# Patient Record
Sex: Female | Born: 1985 | Race: Black or African American | Hispanic: No | Marital: Single | Smoking: Current every day smoker
Health system: Southern US, Community
[De-identification: ages and names within clinical notes are randomized; demographics above are authoritative.]

## PROBLEM LIST (undated history)

## (undated) ENCOUNTER — Inpatient Hospital Stay (HOSPITAL_COMMUNITY): Payer: Self-pay

## (undated) DIAGNOSIS — F329 Major depressive disorder, single episode, unspecified: Secondary | ICD-10-CM

## (undated) DIAGNOSIS — B977 Papillomavirus as the cause of diseases classified elsewhere: Secondary | ICD-10-CM

## (undated) DIAGNOSIS — F410 Panic disorder [episodic paroxysmal anxiety] without agoraphobia: Secondary | ICD-10-CM

## (undated) DIAGNOSIS — F32A Depression, unspecified: Secondary | ICD-10-CM

## (undated) HISTORY — PX: NO PAST SURGERIES: SHX2092

---
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
## 2004-12-14 ENCOUNTER — Ambulatory Visit (HOSPITAL_COMMUNITY): Admission: RE | Admit: 2004-12-14 | Discharge: 2004-12-14 | Payer: Self-pay | Admitting: *Deleted

## 2005-01-16 ENCOUNTER — Inpatient Hospital Stay (HOSPITAL_COMMUNITY): Admission: AD | Admit: 2005-01-16 | Discharge: 2005-01-16 | Payer: Self-pay | Admitting: *Deleted

## 2005-01-16 ENCOUNTER — Ambulatory Visit: Payer: Self-pay | Admitting: *Deleted

## 2005-01-22 ENCOUNTER — Ambulatory Visit: Payer: Self-pay | Admitting: Obstetrics and Gynecology

## 2005-01-22 ENCOUNTER — Inpatient Hospital Stay (HOSPITAL_COMMUNITY): Admission: AD | Admit: 2005-01-22 | Discharge: 2005-01-23 | Payer: Self-pay | Admitting: Family Medicine

## 2005-03-27 ENCOUNTER — Inpatient Hospital Stay (HOSPITAL_COMMUNITY): Admission: AD | Admit: 2005-03-27 | Discharge: 2005-03-29 | Payer: Self-pay | Admitting: *Deleted

## 2005-03-27 ENCOUNTER — Ambulatory Visit: Payer: Self-pay | Admitting: *Deleted

## 2005-11-12 ENCOUNTER — Inpatient Hospital Stay (HOSPITAL_COMMUNITY): Admission: AD | Admit: 2005-11-12 | Discharge: 2005-11-12 | Payer: Self-pay | Admitting: Obstetrics & Gynecology

## 2006-04-10 ENCOUNTER — Emergency Department (HOSPITAL_COMMUNITY): Admission: EM | Admit: 2006-04-10 | Discharge: 2006-04-10 | Payer: Self-pay | Admitting: Family Medicine

## 2006-11-28 ENCOUNTER — Inpatient Hospital Stay (HOSPITAL_COMMUNITY): Admission: AD | Admit: 2006-11-28 | Discharge: 2006-11-28 | Payer: Self-pay | Admitting: Family Medicine

## 2006-11-28 ENCOUNTER — Ambulatory Visit: Payer: Self-pay | Admitting: *Deleted

## 2007-01-12 ENCOUNTER — Inpatient Hospital Stay (HOSPITAL_COMMUNITY): Admission: AD | Admit: 2007-01-12 | Discharge: 2007-01-12 | Payer: Self-pay | Admitting: Obstetrics & Gynecology

## 2007-01-12 ENCOUNTER — Ambulatory Visit: Payer: Self-pay | Admitting: Advanced Practice Midwife

## 2007-01-15 ENCOUNTER — Ambulatory Visit: Payer: Self-pay | Admitting: *Deleted

## 2007-01-22 ENCOUNTER — Ambulatory Visit: Payer: Self-pay | Admitting: Obstetrics & Gynecology

## 2007-02-06 ENCOUNTER — Inpatient Hospital Stay (HOSPITAL_COMMUNITY): Admission: AD | Admit: 2007-02-06 | Discharge: 2007-02-06 | Payer: Self-pay | Admitting: Obstetrics and Gynecology

## 2007-02-06 ENCOUNTER — Ambulatory Visit: Payer: Self-pay | Admitting: Obstetrics and Gynecology

## 2007-02-19 ENCOUNTER — Ambulatory Visit: Payer: Self-pay | Admitting: Obstetrics & Gynecology

## 2007-02-26 ENCOUNTER — Inpatient Hospital Stay (HOSPITAL_COMMUNITY): Admission: RE | Admit: 2007-02-26 | Discharge: 2007-02-28 | Payer: Self-pay | Admitting: Obstetrics & Gynecology

## 2007-02-26 ENCOUNTER — Ambulatory Visit: Payer: Self-pay | Admitting: *Deleted

## 2009-08-28 ENCOUNTER — Emergency Department (HOSPITAL_COMMUNITY): Admission: EM | Admit: 2009-08-28 | Discharge: 2009-08-28 | Payer: Self-pay | Admitting: Emergency Medicine

## 2009-11-24 ENCOUNTER — Emergency Department (HOSPITAL_COMMUNITY)
Admission: EM | Admit: 2009-11-24 | Discharge: 2009-11-24 | Payer: Self-pay | Source: Home / Self Care | Admitting: Emergency Medicine

## 2010-03-23 ENCOUNTER — Emergency Department (HOSPITAL_COMMUNITY)
Admission: EM | Admit: 2010-03-23 | Discharge: 2010-03-23 | Payer: Self-pay | Source: Home / Self Care | Admitting: Family Medicine

## 2010-05-11 LAB — URINE MICROSCOPIC-ADD ON

## 2010-05-11 LAB — URINALYSIS, ROUTINE W REFLEX MICROSCOPIC
Glucose, UA: NEGATIVE mg/dL
Hgb urine dipstick: NEGATIVE
Ketones, ur: 15 mg/dL — AB
Nitrite: NEGATIVE
Protein, ur: NEGATIVE mg/dL
Specific Gravity, Urine: 1.026 (ref 1.005–1.030)
Urobilinogen, UA: 1 mg/dL (ref 0.0–1.0)
pH: 7 (ref 5.0–8.0)

## 2010-05-11 LAB — WET PREP, GENITAL
Trich, Wet Prep: NONE SEEN
Yeast Wet Prep HPF POC: NONE SEEN

## 2010-05-11 LAB — POCT PREGNANCY, URINE: Preg Test, Ur: NEGATIVE

## 2010-05-11 LAB — GC/CHLAMYDIA PROBE AMP, GENITAL
Chlamydia, DNA Probe: NEGATIVE
GC Probe Amp, Genital: POSITIVE — AB

## 2010-05-14 LAB — URINALYSIS, ROUTINE W REFLEX MICROSCOPIC
Bilirubin Urine: NEGATIVE
Glucose, UA: NEGATIVE mg/dL
Hgb urine dipstick: NEGATIVE
Ketones, ur: NEGATIVE mg/dL
Nitrite: NEGATIVE
Protein, ur: NEGATIVE mg/dL
Specific Gravity, Urine: 1.019 (ref 1.005–1.030)
Urobilinogen, UA: 0.2 mg/dL (ref 0.0–1.0)
pH: 6 (ref 5.0–8.0)

## 2010-05-14 LAB — POCT PREGNANCY, URINE: Preg Test, Ur: NEGATIVE

## 2010-08-28 ENCOUNTER — Inpatient Hospital Stay (HOSPITAL_COMMUNITY)
Admission: AD | Admit: 2010-08-28 | Discharge: 2010-08-29 | Disposition: A | Discharge status: Home / Self Care | Payer: Medicaid Other | Source: Ambulatory Visit | Attending: Obstetrics & Gynecology | Admitting: Obstetrics & Gynecology

## 2010-08-28 ENCOUNTER — Inpatient Hospital Stay (HOSPITAL_COMMUNITY): Payer: Medicaid Other

## 2010-08-28 DIAGNOSIS — O209 Hemorrhage in early pregnancy, unspecified: Secondary | ICD-10-CM | POA: Insufficient documentation

## 2010-08-28 LAB — URINALYSIS, ROUTINE W REFLEX MICROSCOPIC
Bilirubin Urine: NEGATIVE
Glucose, UA: NEGATIVE mg/dL
Hgb urine dipstick: NEGATIVE
Ketones, ur: NEGATIVE mg/dL
Nitrite: NEGATIVE
Protein, ur: NEGATIVE mg/dL
Specific Gravity, Urine: >1.03 — ABNORMAL HIGH (ref 1.005–1.030)
Urobilinogen, UA: 0.2 mg/dL (ref 0.0–1.0)
pH: 6 (ref 5.0–8.0)

## 2010-08-28 LAB — URINE MICROSCOPIC-ADD ON

## 2010-08-28 LAB — CBC
HCT: 29.6 % — ABNORMAL LOW (ref 36.0–46.0)
Hemoglobin: 9.9 g/dL — ABNORMAL LOW (ref 12.0–15.0)
MCH: 30.5 pg (ref 26.0–34.0)
MCHC: 33.4 g/dL (ref 30.0–36.0)
MCV: 91.1 fL (ref 78.0–100.0)
Platelets: 208 10*3/uL (ref 150–400)
RBC: 3.25 MIL/uL — ABNORMAL LOW (ref 3.87–5.11)
RDW: 13.6 % (ref 11.5–15.5)
WBC: 6.2 10*3/uL (ref 4.0–10.5)

## 2010-08-28 LAB — WET PREP, GENITAL
Trich, Wet Prep: NONE SEEN
Yeast Wet Prep HPF POC: NONE SEEN

## 2010-08-28 LAB — ABO/RH: ABO/RH(D): O POS

## 2010-08-29 LAB — GC/CHLAMYDIA PROBE AMP, GENITAL
Chlamydia, DNA Probe: NEGATIVE
GC Probe Amp, Genital: NEGATIVE

## 2010-12-01 LAB — CBC
HCT: 30.1 — ABNORMAL LOW
Hemoglobin: 10.3 — ABNORMAL LOW
MCHC: 34.1
MCV: 80.7
Platelets: 196
RBC: 3.73 — ABNORMAL LOW
RDW: 15.1
WBC: 6.9

## 2010-12-01 LAB — POCT URINALYSIS DIP (DEVICE)
Glucose, UA: NEGATIVE
Hgb urine dipstick: NEGATIVE
Ketones, ur: NEGATIVE
Nitrite: NEGATIVE
Operator id: 135281
Protein, ur: NEGATIVE
Specific Gravity, Urine: 1.025
Urobilinogen, UA: 4 — ABNORMAL HIGH
pH: 7

## 2010-12-01 LAB — RPR: RPR Ser Ql: NONREACTIVE

## 2010-12-03 ENCOUNTER — Inpatient Hospital Stay: Payer: Self-pay

## 2010-12-05 LAB — URINALYSIS, ROUTINE W REFLEX MICROSCOPIC
Bilirubin Urine: NEGATIVE
Glucose, UA: NEGATIVE
Hgb urine dipstick: NEGATIVE
Specific Gravity, Urine: 1.01
pH: 6

## 2010-12-05 LAB — POCT URINALYSIS DIP (DEVICE)
Bilirubin Urine: NEGATIVE
Bilirubin Urine: NEGATIVE
Glucose, UA: NEGATIVE
Glucose, UA: NEGATIVE
Hgb urine dipstick: NEGATIVE
Hgb urine dipstick: NEGATIVE
Ketones, ur: NEGATIVE
Ketones, ur: NEGATIVE
pH: 7
pH: 7.5

## 2010-12-05 LAB — STREP B DNA PROBE: Strep Group B Ag: POSITIVE

## 2010-12-05 LAB — GC/CHLAMYDIA PROBE AMP, GENITAL: Chlamydia, DNA Probe: NEGATIVE

## 2010-12-05 LAB — WET PREP, GENITAL
Clue Cells Wet Prep HPF POC: NONE SEEN
Yeast Wet Prep HPF POC: NONE SEEN

## 2010-12-05 LAB — RAPID URINE DRUG SCREEN, HOSP PERFORMED
Barbiturates: NOT DETECTED
Opiates: NOT DETECTED

## 2010-12-05 LAB — URINE MICROSCOPIC-ADD ON: RBC / HPF: NONE SEEN

## 2010-12-07 LAB — CBC
Hemoglobin: 11.3 — ABNORMAL LOW
MCHC: 34
RBC: 3.73 — ABNORMAL LOW
WBC: 7.9

## 2010-12-07 LAB — URINALYSIS, ROUTINE W REFLEX MICROSCOPIC
Bilirubin Urine: NEGATIVE
Nitrite: NEGATIVE
Specific Gravity, Urine: 1.025
Urobilinogen, UA: 0.2
pH: 7

## 2010-12-07 LAB — URINE MICROSCOPIC-ADD ON

## 2010-12-07 LAB — RAPID URINE DRUG SCREEN, HOSP PERFORMED
Amphetamines: NOT DETECTED
Barbiturates: NOT DETECTED
Benzodiazepines: NOT DETECTED
Tetrahydrocannabinol: NOT DETECTED

## 2010-12-07 LAB — TYPE AND SCREEN
ABO/RH(D): O POS
Antibody Screen: NEGATIVE

## 2010-12-07 LAB — ABO/RH: ABO/RH(D): O POS

## 2011-01-26 ENCOUNTER — Inpatient Hospital Stay (HOSPITAL_COMMUNITY)
Admission: AD | Admit: 2011-01-26 | Discharge: 2011-01-26 | Disposition: A | Discharge status: Home / Self Care | Payer: Medicaid Other | Source: Ambulatory Visit | Attending: Family Medicine | Admitting: Family Medicine

## 2011-01-26 ENCOUNTER — Encounter (HOSPITAL_COMMUNITY): Payer: Self-pay | Admitting: *Deleted

## 2011-01-26 HISTORY — DX: Depression, unspecified: F32.A

## 2011-01-26 HISTORY — DX: Major depressive disorder, single episode, unspecified: F32.9

## 2011-01-26 HISTORY — DX: Panic disorder (episodic paroxysmal anxiety): F41.0

## 2011-01-26 MED ORDER — DICLOFENAC SODIUM 75 MG PO TBEC: 75.0000 mg | DELAYED_RELEASE_TABLET | Freq: Two times a day (BID) | ORAL | Status: AC

## 2011-01-26 MED ORDER — KETOROLAC TROMETHAMINE 60 MG/2ML IM SOLN
60.0000 mg | Freq: Once | INTRAMUSCULAR | Status: AC
  Administered 2011-01-26: 60 mg via INTRAMUSCULAR
  Filled 2011-01-26: qty 2

## 2011-01-26 NOTE — ED Notes (Signed)
Delivered vaginal on the 12/04/10; started Depo 12/06/10; pt has not stopped bleeding since her delivery; c/o cramping in her back and abdomen, that she rates a 8;

## 2011-01-26 NOTE — ED Notes (Signed)
Rx for Diclofenac 75 mg EC bid #60, no refills called in to pharmacy by RN per CNM request;

## 2011-01-26 NOTE — Progress Notes (Signed)
Pt states, " I delivered vaginally at [facility] 12-04-10, and I've have bleeding ever since I delivered. It slows down to spotting then picks back up the next day. I'm having cramping in low abdomen and low back and last night it felt like contractions. I also have pain in left knee and my middle finger on my right hand."

## 2011-01-26 NOTE — ED Provider Notes (Signed)
History    Stephanie Mckay is a 25 YO [id number] who delivered recently on 12/04/2010 presents today with ongoing bleeding and cramps.  Chief Complaint  Patient presents with   Vaginal Bleeding   Abdominal Cramping   Back Pain   Hand Pain   Knee Pain   HPI Patient reports after giving birth (uncomplicated pregnancy and delivery) she continues to have bleeding and cramping in addition to back pain. She has not experienced this with prior pregnancy. She uses about 2-3 pads within 24 hours and has lower abdominal tenderness. She reports she received one depo-provera shot and is no longer breast feeding. Patient is having sexual intercourse.   OB History    Grav Para Term Preterm Abortions TAB SAB Ect Mult Living   2 2 2       2       Past Medical History  Diagnosis Date   Depression    Panic attack     Past Surgical History  Procedure Date   No past surgeries     No family history on file.  History  Substance Use Topics   Smoking status: Current Everyday Smoker -- 0.5 packs/day   Smokeless tobacco: Not on file   Alcohol Use: No    Allergies:  Allergies  Allergen Reactions   Vicodin (Hydrocodone-Acetaminophen) Other (See Comments)    Head aches   Penicillins Rash    Prescriptions prior to admission  Medication Sig Dispense Refill   loratadine (CLARITIN) 10 MG tablet Take 10 mg by mouth daily as needed. allergies        ranitidine (ZANTAC) 150 MG tablet Take 150 mg by mouth daily as needed. heartburn        sertraline (ZOLOFT) 50 MG tablet Take 50 mg by mouth daily.          Review of Systems  Constitutional: Negative for fever and chills.  Gastrointestinal: Positive for abdominal pain and diarrhea. Negative for nausea, vomiting, constipation and blood in stool.  Genitourinary: Negative for dysuria and urgency.  Musculoskeletal: Positive for myalgias.  Skin: Negative for rash.   Physical Exam   Blood pressure 123/78, pulse 64, temperature 98.9  F (37.2 C), temperature source Oral, resp. rate 18, height 5' 6.5" (1.689 m), weight 100.415 kg (221 lb 6 oz), last menstrual period 12/04/2010.  Physical Exam  Constitutional: She is oriented to person, place, and time. She appears well-developed and well-nourished.  HENT:  Head: Normocephalic.  Eyes: Pupils are equal, round, and reactive to light.  Neck: Normal range of motion.  Cardiovascular: Normal rate, regular rhythm, normal heart sounds and intact distal pulses.  Exam reveals no gallop and no friction rub.   No murmur heard. Respiratory: Effort normal and breath sounds normal. She has no wheezes. She has no rales.  GI: Soft. Bowel sounds are normal. There is tenderness.       Mild tenderness with palpation to suprapubic area.  Genitourinary: Vagina normal and uterus normal.  Musculoskeletal: Normal range of motion.  Neurological: She is alert and oriented to person, place, and time.  Skin: Skin is warm.    MAU Course  Procedures Bimanual exam - normal   Assessment and Plan  1. Postpartum pain & bleeding: Informed patient the amount of bleeding she experiencing is normal especially with use of Depo-Provera, which can prolong postpartum bleeding. Back pain and contractions are normal symptoms post partum and can be treated with ibuprofen or other NSAIDs. Recommend patient receive ketorolac (TORADOL) injection 60 mg  in MAU today and will be sent home with diclofenac 50 mg for pain relief. Patient understood and was agreeable to plan. Patient to be discharged home.   Mosetta Putt, PA-S 01/26/2011, 5:24 PM

## 2011-01-26 NOTE — ED Notes (Signed)
Pt wishes to change her pharmacy of choice and needs her rx to be switched to that location;

## 2011-01-27 NOTE — ED Provider Notes (Signed)
I have seen and examined this patient and agree with the above assessment and plan. Teyton Pattillo E.

## 2011-09-24 IMAGING — US US OB COMP +14 WK
1 series · 12 of 28 positions shown · non-contrast
Comparison: none

[Series 1: us ob comp +14 wk · 12 of 64 slices shown]
[im 3/64]
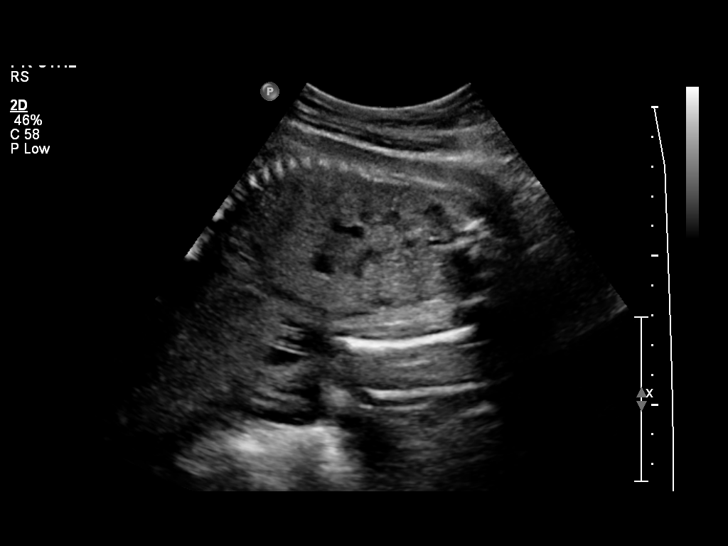
[im 8/64]
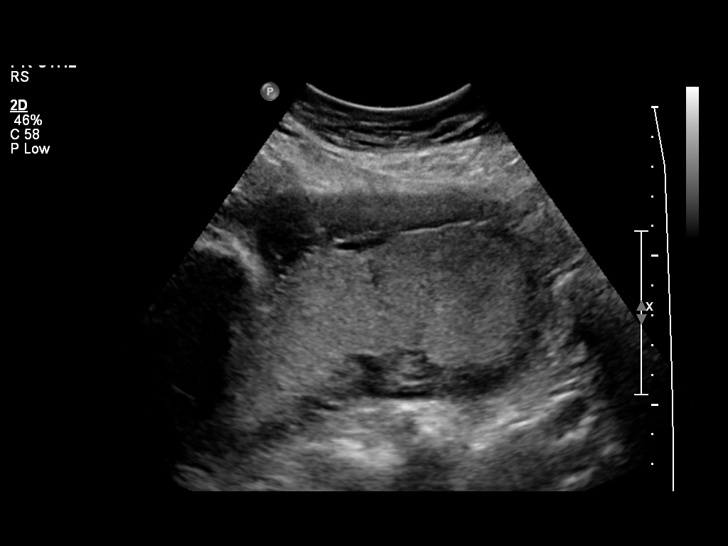
[im 12/64]
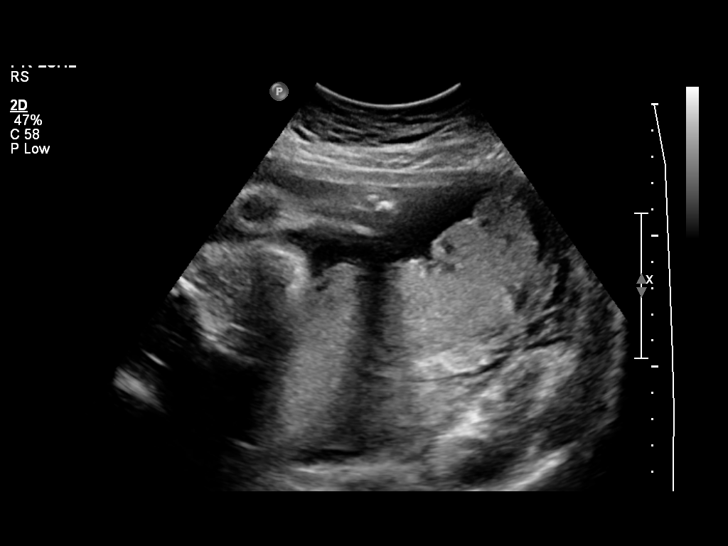
[im 19/64]
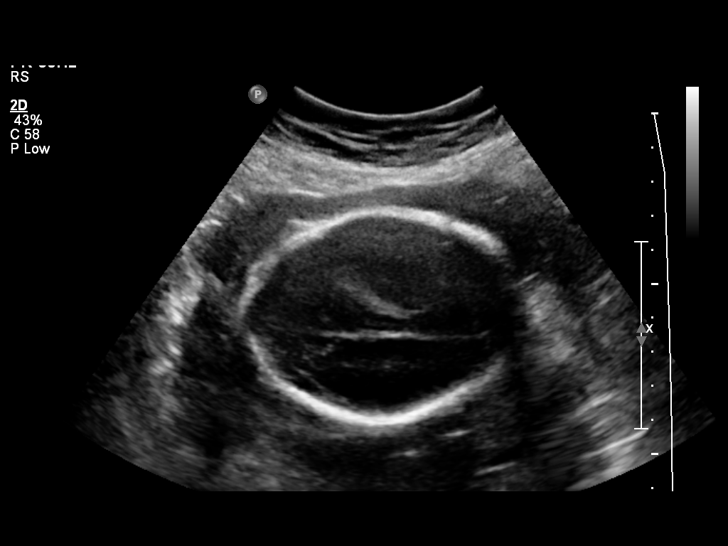
[im 24/64]
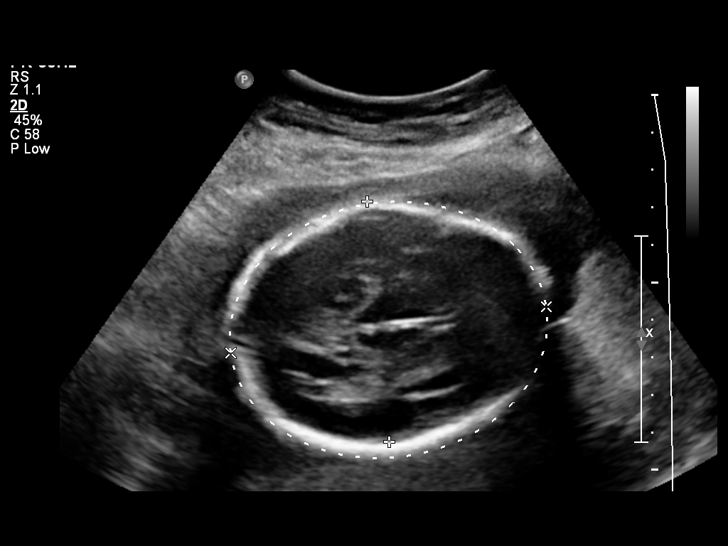
[im 29/64]
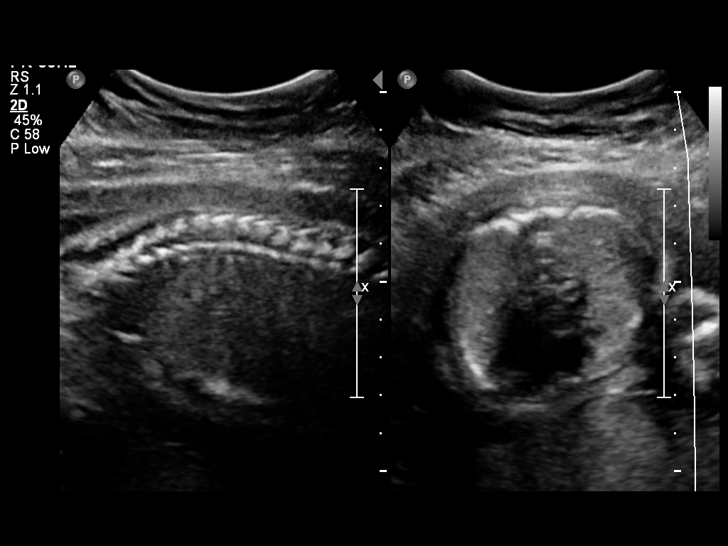
[im 36/64]
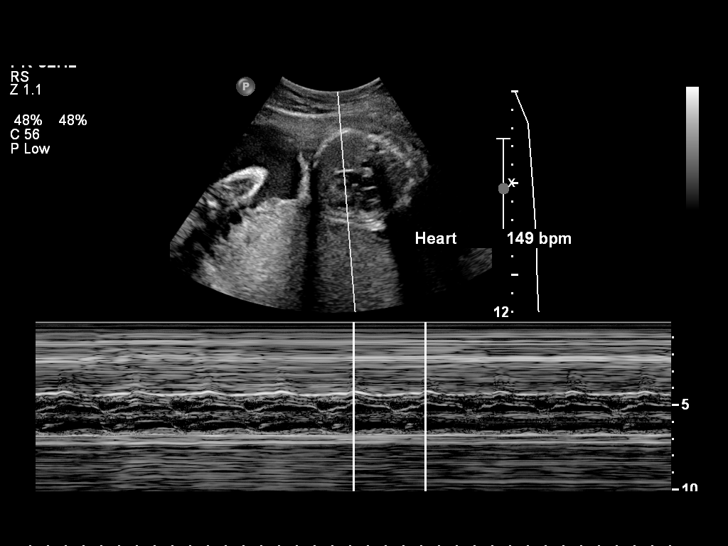
[im 40/64]
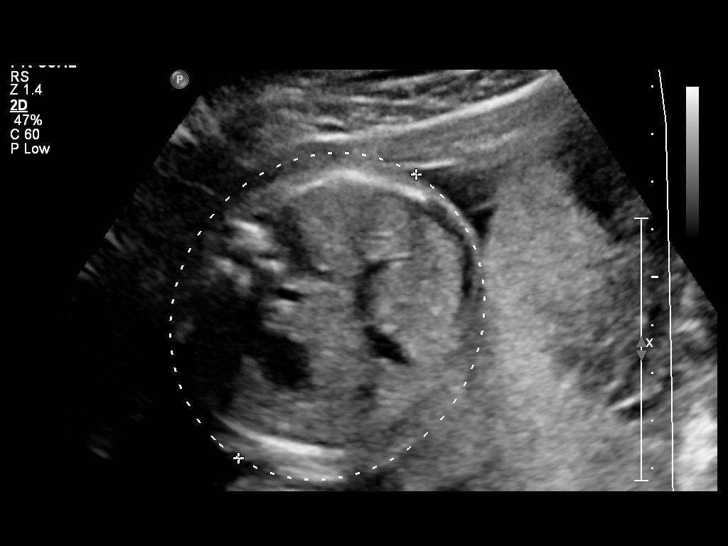
[im 45/64]
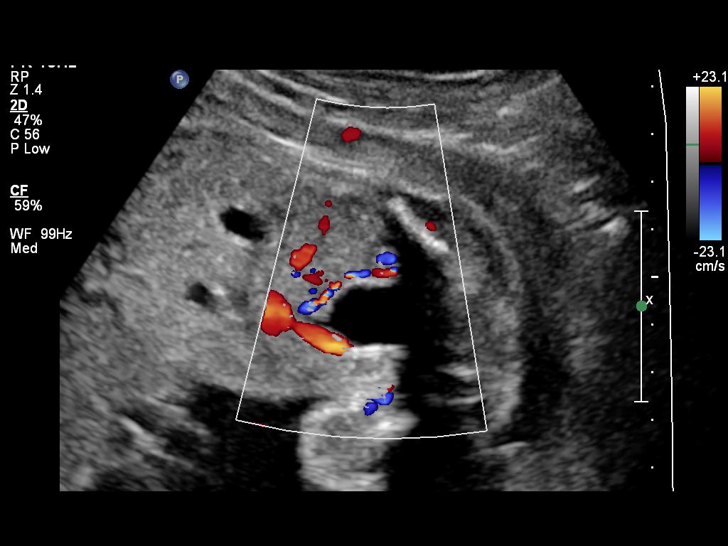
[im 52/64]
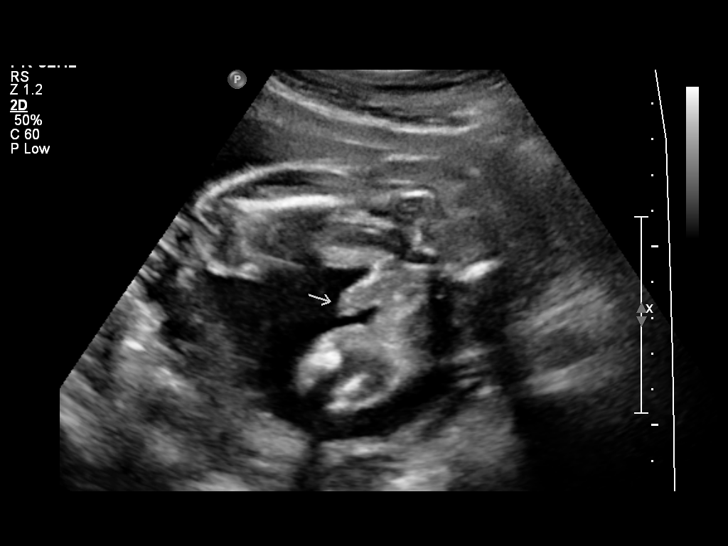
[im 57/64]
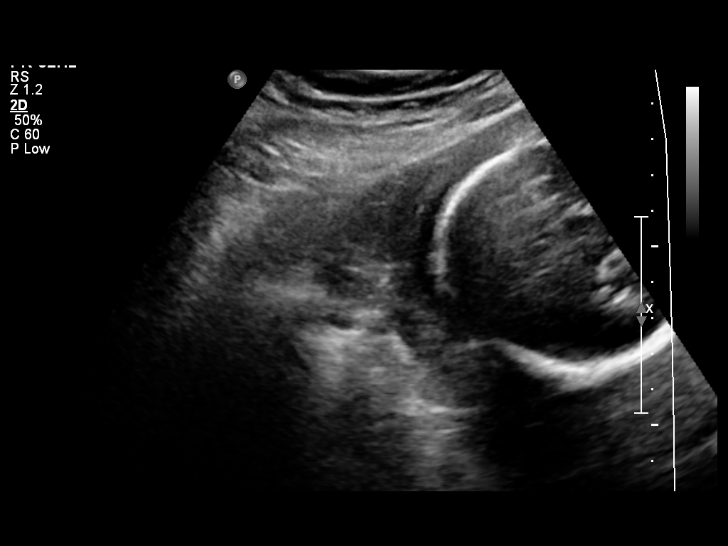
[im 61/64]
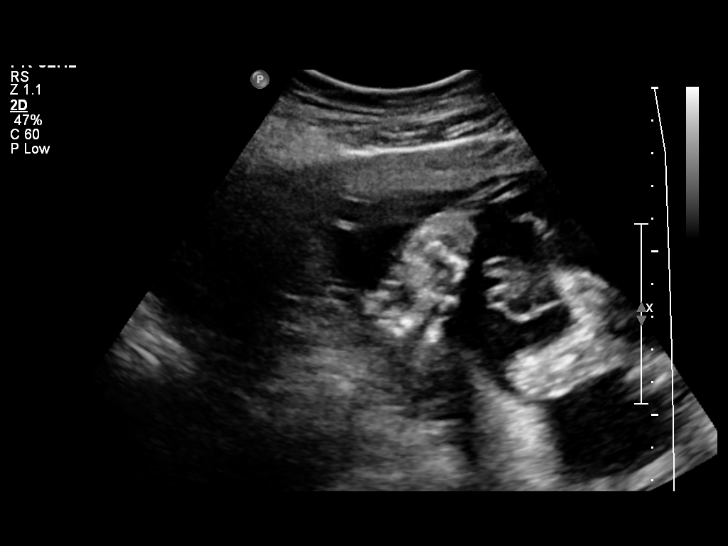

[12 of 28 positions shown; findings below may reference images not displayed]

OBSTETRICS REPORT
                      (Signed Final 08/29/2010 [DATE])

Procedures

 US OB COMP + 14 WK                                    76805.1
Indications

 Unsure of LMP;  Establish Gestational [AGE]
 Vaginal bleeding, unknown etiology
 No or Little Prenatal Care
 Pain - RLQ
Fetal Evaluation

 Fetal Heart Rate:  149                          bpm
 Cardiac Activity:  Observed
 Presentation:      Breech
 Placenta:          Posterior, above cervical
                    os
 P. Cord            Visualized
 Insertion:

 Amniotic Fluid
 AFI FV:      Subjectively within normal limits
                                             Larg Pckt:     6.8  cm
Biometry

 BPD:     63.8  mm     G. Age:  25w 6d                CI:        71.53   70 - 86
                                                      FL/HC:      17.8   18.7 -

 HC:     240.2  mm     G. Age:  26w 1d       51  %    HC/AC:      1.15   1.04 -

 AC:     208.3  mm     G. Age:  25w 3d       40  %    FL/BPD:     67.1   71 - 87
 FL:      42.8  mm     G. Age:  24w 0d        7  %    FL/AC:      20.5   20 - 24

 Est. FW:     755  gm    1 lb 11 oz      43  %
Gestational Age

 LMP:           26w 6d        Date:  02/21/10                 EDD:   11/28/10
 U/S Today:     25w 3d                                        EDD:   12/08/10
 Best:          25w 3d     Det. By:  U/S (08/28/10)           EDD:   12/08/10
Anatomy

 Cranium:           Appears normal      Aortic Arch:       Basic anatomy
                                                           exam per order
 Fetal Cavum:       Appears normal      Ductal Arch:       Basic anatomy
                                                           exam per order
 Ventricles:        Appears normal      Diaphragm:         Basic anatomy
                                                           exam per order
 Choroid Plexus:    Appears normal      Stomach:           Appears
                                                           normal, left
                                                           sided
 Cerebellum:        Appears normal      Abdomen:           Appears normal
 Posterior Fossa:   Appears normal      Abdominal Wall:    Appears nml
                                                           (cord insert,
                                                           abd wall)
 Nuchal Fold:       Not applicable      Cord Vessels:      Appears normal
                    (>20 wks GA)                           (3 vessel cord)
 Face:              Lips appear         Kidneys:           Appear normal
                    normal
 Heart:             Not well            Bladder:           Appears normal
                    visualized
 RVOT:              Basic anatomy       Spine:             Appears normal
                    exam per order
 LVOT:              Basic anatomy       Limbs:             Four extremities
                    exam per order                         visualized
                                                           (basic anatomy
                                                           exam)

 Other:     Fetus appears to be a male. Technically difficult due to
            advanced GA and fetal position.
Cervix Uterus Adnexa

 Cervical Length:    3.9      cm

 Cervix:       Normal appearance by transabdominal scan.
 Uterus:       No abnormality visualized.
 Cul De Sac:   No free fluid seen.
 Left Ovary:    Not visualized.
 Right Ovary:   Not visualized.

 Adnexa:     No abnormality visualized.
Comments

 This study was performed as an on call procedure and was
 initially reviewed by Dr. Krajina Sovilj
Impression

 Single intrauterine gestation demonstrating an estimated
 gestational age by ultrasound of 25w 3d. Fetal parameters
 correlate with this composite EGA which is 62days behind
 expected EGA by unsure LMP. EFW is currently at the 43%.

 Visualized fetal anatomy appears normal with overall
 assessment compromised by advanced gestational age and
 fetal position.

 No focal placental abnormalities are noted.

 Subjectively and quantitatively normal amniotic fluid volume.
 Normal cervical length and appearance.

## 2012-02-27 NOTE — L&D Delivery Note (Signed)
Delivery Note At 5:00 PM a viable female was delivered via Vaginal, Spontaneous Delivery (Presentation: ; Occiput Anterior).  APGAR: 8, 9; weight 7 lb 10.2 oz (3465 g).   Placenta status: , Spontaneous.  Cord: 3 vessels with the following complications: None.    Anesthesia: Epidural  Episiotomy: None Lacerations: Labial Suture Repair: vicryl 4.0 on SH Est. Blood Loss (mL): 300  Mom to postpartum.  Baby to maternal chest  Repaired labial tear with running vicyrl 4.0 on SH. Hemostatic. No acute issues. Counts correct. EBL 300cc.  Jolyn Lent RYAN 12/14/2012, 7:02 PM

## 2012-04-09 ENCOUNTER — Emergency Department (HOSPITAL_COMMUNITY)
Admission: EM | Admit: 2012-04-09 | Discharge: 2012-04-09 | Disposition: A | Discharge status: Home / Self Care | Payer: Medicaid Other | Attending: Emergency Medicine | Admitting: Emergency Medicine

## 2012-04-09 ENCOUNTER — Emergency Department (HOSPITAL_COMMUNITY): Payer: Medicaid Other

## 2012-04-09 ENCOUNTER — Encounter (HOSPITAL_COMMUNITY): Payer: Self-pay | Admitting: Emergency Medicine

## 2012-04-09 DIAGNOSIS — Z3201 Encounter for pregnancy test, result positive: Secondary | ICD-10-CM | POA: Insufficient documentation

## 2012-04-09 DIAGNOSIS — F3289 Other specified depressive episodes: Secondary | ICD-10-CM | POA: Insufficient documentation

## 2012-04-09 DIAGNOSIS — Z331 Pregnant state, incidental: Secondary | ICD-10-CM | POA: Insufficient documentation

## 2012-04-09 DIAGNOSIS — N760 Acute vaginitis: Secondary | ICD-10-CM | POA: Insufficient documentation

## 2012-04-09 DIAGNOSIS — F17200 Nicotine dependence, unspecified, uncomplicated: Secondary | ICD-10-CM | POA: Insufficient documentation

## 2012-04-09 DIAGNOSIS — Z79899 Other long term (current) drug therapy: Secondary | ICD-10-CM | POA: Insufficient documentation

## 2012-04-09 DIAGNOSIS — F1210 Cannabis abuse, uncomplicated: Secondary | ICD-10-CM | POA: Insufficient documentation

## 2012-04-09 DIAGNOSIS — Z3490 Encounter for supervision of normal pregnancy, unspecified, unspecified trimester: Secondary | ICD-10-CM

## 2012-04-09 DIAGNOSIS — F410 Panic disorder [episodic paroxysmal anxiety] without agoraphobia: Secondary | ICD-10-CM | POA: Insufficient documentation

## 2012-04-09 DIAGNOSIS — F329 Major depressive disorder, single episode, unspecified: Secondary | ICD-10-CM | POA: Insufficient documentation

## 2012-04-09 LAB — WET PREP, GENITAL: Trich, Wet Prep: NONE SEEN

## 2012-04-09 LAB — HCG, QUANTITATIVE, PREGNANCY: hCG, Beta Chain, Quant, S: 254 m[IU]/mL — ABNORMAL HIGH (ref <5)

## 2012-04-09 LAB — POCT PREGNANCY, URINE
Preg Test, Ur: POSITIVE — AB
Preg Test, Ur: POSITIVE — AB

## 2012-04-09 MED ORDER — CLOTRIMAZOLE 1 % VA CREA: 1.0000 | TOPICAL_CREAM | Freq: Every day | VAGINAL | Status: AC

## 2012-04-09 MED ORDER — METRONIDAZOLE 500 MG PO TABS: 500.0000 mg | ORAL_TABLET | Freq: Two times a day (BID) | ORAL | Status: DC

## 2012-04-09 MED ORDER — AZITHROMYCIN 1 G PO PACK
2.0000 g | PACK | Freq: Once | ORAL | Status: AC
  Administered 2012-04-09: 2 g via ORAL
  Filled 2012-04-09: qty 2

## 2012-04-09 NOTE — ED Provider Notes (Signed)
History     CSN: [id number]  Arrival date & time 04/09/12  0050   First MD Initiated Contact with Patient 04/09/12 0103      Chief Complaint  Patient presents with  . Vaginal Discharge    (Consider location/radiation/quality/duration/timing/severity/associated sxs/prior treatment) Patient is a 27 y.o. female presenting with vaginal discharge. The history is provided by the patient.  Vaginal Discharge This is a new problem. The current episode started 2 days ago. The problem occurs constantly. The problem has not changed since onset.Pertinent negatives include no abdominal pain. Nothing aggravates the symptoms. Nothing relieves the symptoms. She has tried nothing for the symptoms.    Past Medical History  Diagnosis Date  . Depression   . Panic attack     Past Surgical History  Procedure Laterality Date  . No past surgeries      No family history on file.  History  Substance Use Topics  . Smoking status: Current Every Day Smoker -- 0.50 packs/day  . Smokeless tobacco: Not on file  . Alcohol Use: No    OB History   Grav Para Term Preterm Abortions TAB SAB Ect Mult Living   2 2 2       2       Review of Systems  Gastrointestinal: Negative for abdominal pain.  Genitourinary: Positive for vaginal discharge.  All other systems reviewed and are negative.    Allergies  Vicodin and Penicillins  Home Medications   Current Outpatient Rx  Name  Route  Sig  Dispense  Refill  . PRESCRIPTION MEDICATION   Oral   Take 1 tablet by mouth once. Took 1 muscle relaxer, given by a friend         . sertraline (ZOLOFT) 50 MG tablet   Oral   Take 50 mg by mouth daily.           . clotrimazole (GYNE-LOTRIMIN) 1 % vaginal cream   Vaginal   Place 1 Applicatorful vaginally at bedtime.   45 g   0   . metroNIDAZOLE (FLAGYL) 500 MG tablet   Oral   Take 1 tablet (500 mg total) by mouth 2 (two) times daily.   14 tablet   0     BP 120/75  Pulse 77  Temp(Src) 98.5 F  (36.9 C) (Oral)  SpO2 100%  LMP 03/17/2012  Physical Exam  Constitutional: She is oriented to person, place, and time. She appears well-developed and well-nourished.  HENT:  Head: Normocephalic and atraumatic.  Eyes: Conjunctivae and EOM are normal. Pupils are equal, round, and reactive to light.  Neck: Normal range of motion.  Cardiovascular: Normal rate, regular rhythm and normal heart sounds.   Pulmonary/Chest: Effort normal and breath sounds normal.  Abdominal: Soft. Bowel sounds are normal.  Genitourinary: Vaginal discharge found.  Musculoskeletal: Normal range of motion.  Neurological: She is alert and oriented to person, place, and time.  Skin: Skin is warm and dry.  Psychiatric: She has a normal mood and affect. Her behavior is normal.    ED Course  Procedures (including critical care time)  Labs Reviewed  WET PREP, GENITAL - Abnormal; Notable for the following:    Yeast Wet Prep HPF POC MODERATE (*)    Clue Cells Wet Prep HPF POC TOO NUMEROUS TO COUNT (*)    WBC, Wet Prep HPF POC MANY (*)    All other components within normal limits  HCG, QUANTITATIVE, PREGNANCY - Abnormal; Notable for the following:    hCG, Beta  Chain, Quant, S 254 (*)    All other components within normal limits  POCT PREGNANCY, URINE - Abnormal; Notable for the following:    Preg Test, Ur POSITIVE (*)    All other components within normal limits  GC/CHLAMYDIA PROBE AMP  POCT PREGNANCY, URINE   No results found.   1. Pregnancy   2. Vaginitis       MDM  No abd pain.  Quant 250.  No bleeding.  No iup.  Will dc to [facility] oupt,  Ret new/worsening sxs        Ander Wamser Lytle Michaels, MD 04/09/12 0423

## 2012-04-09 NOTE — ED Notes (Signed)
PT. REPORTS VAGINAL DISCHARGE WITH ITCHING FOR SEVERAL DAYS , DENIES DYSURIA OR FEVER.

## 2012-04-09 NOTE — ED Notes (Signed)
POC pregnancy test is POSITIVE.  Andreas Ohm RN notified and POC testing edit sheet sent to main lab for correction.  Morrell Riddle, EMT

## 2012-04-12 LAB — GC/CHLAMYDIA PROBE AMP: CT Probe RNA: POSITIVE — AB

## 2012-04-13 ENCOUNTER — Telehealth (HOSPITAL_COMMUNITY): Payer: Self-pay | Admitting: Emergency Medicine

## 2012-04-13 NOTE — ED Notes (Addendum)
Patient has +Chlamydia. Checking to see if appropriately treated. °

## 2012-04-13 NOTE — ED Notes (Signed)
+  Chlamydia. Patient treated with Zithromax. Per protocol MD. [facility] faxed.
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
## 2012-07-31 ENCOUNTER — Other Ambulatory Visit: Payer: Self-pay | Admitting: Nurse Practitioner

## 2012-07-31 DIAGNOSIS — Z3689 Encounter for other specified antenatal screening: Secondary | ICD-10-CM

## 2012-07-31 LAB — OB RESULTS CONSOLE ABO/RH: RH Type: POSITIVE

## 2012-07-31 LAB — OB RESULTS CONSOLE RPR: RPR: NONREACTIVE

## 2012-07-31 LAB — OB RESULTS CONSOLE ANTIBODY SCREEN: Antibody Screen: NEGATIVE

## 2012-07-31 LAB — OB RESULTS CONSOLE HEPATITIS B SURFACE ANTIGEN: Hepatitis B Surface Ag: NEGATIVE

## 2012-08-02 ENCOUNTER — Emergency Department (HOSPITAL_COMMUNITY)
Admission: EM | Admit: 2012-08-02 | Discharge: 2012-08-03 | Disposition: A | Discharge status: Home / Self Care | Payer: Medicaid Other | Attending: Emergency Medicine | Admitting: Emergency Medicine

## 2012-08-02 ENCOUNTER — Encounter (HOSPITAL_COMMUNITY): Payer: Self-pay | Admitting: Adult Health

## 2012-08-02 DIAGNOSIS — S0990XA Unspecified injury of head, initial encounter: Secondary | ICD-10-CM | POA: Insufficient documentation

## 2012-08-02 DIAGNOSIS — Z79899 Other long term (current) drug therapy: Secondary | ICD-10-CM | POA: Insufficient documentation

## 2012-08-02 DIAGNOSIS — Z880 Allergy status to penicillin: Secondary | ICD-10-CM | POA: Insufficient documentation

## 2012-08-02 DIAGNOSIS — O99330 Smoking (tobacco) complicating pregnancy, unspecified trimester: Secondary | ICD-10-CM | POA: Insufficient documentation

## 2012-08-02 DIAGNOSIS — S1093XA Contusion of unspecified part of neck, initial encounter: Secondary | ICD-10-CM | POA: Insufficient documentation

## 2012-08-02 DIAGNOSIS — F3289 Other specified depressive episodes: Secondary | ICD-10-CM | POA: Insufficient documentation

## 2012-08-02 DIAGNOSIS — S0003XA Contusion of scalp, initial encounter: Secondary | ICD-10-CM | POA: Insufficient documentation

## 2012-08-02 DIAGNOSIS — F329 Major depressive disorder, single episode, unspecified: Secondary | ICD-10-CM | POA: Insufficient documentation

## 2012-08-02 DIAGNOSIS — O9989 Other specified diseases and conditions complicating pregnancy, childbirth and the puerperium: Secondary | ICD-10-CM | POA: Insufficient documentation

## 2012-08-02 DIAGNOSIS — F410 Panic disorder [episodic paroxysmal anxiety] without agoraphobia: Secondary | ICD-10-CM | POA: Insufficient documentation

## 2012-08-02 DIAGNOSIS — S0083XA Contusion of other part of head, initial encounter: Secondary | ICD-10-CM
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
## 2012-08-03 ENCOUNTER — Emergency Department (HOSPITAL_COMMUNITY): Payer: Medicaid Other

## 2012-08-03 MED ORDER — ACETAMINOPHEN 325 MG PO TABS
650.0000 mg | ORAL_TABLET | Freq: Once | ORAL | Status: AC
  Administered 2012-08-03: 650 mg via ORAL
  Filled 2012-08-03: qty 2
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
## 2012-08-03 NOTE — ED Notes (Signed)
Spoke with representative from [facility] to find out information RE: their services per the [facility] # [phone]. Informed her that Pt. Would be calling her back for assistance.
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
## 2012-08-03 NOTE — ED Notes (Signed)
Pt. Speaking to Hotline representative from [facility] at this time about place to go. Ordered breakfast.
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
## 2012-08-03 NOTE — ED Notes (Signed)
FHT 142-148.

## 2012-08-03 NOTE — ED Provider Notes (Signed)
Medical screening examination/treatment/procedure(s) were performed by non-physician practitioner and as supervising physician I was immediately available for consultation/collaboration.   Sarinah Doetsch L Timiyah Romito, MD 08/03/12 0724 
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
## 2012-08-07 ENCOUNTER — Ambulatory Visit (HOSPITAL_COMMUNITY)
Admission: RE | Admit: 2012-08-07 | Discharge: 2012-08-07 | Disposition: A | Discharge status: Home / Self Care | Payer: Medicaid Other | Source: Ambulatory Visit | Attending: Nurse Practitioner | Admitting: Nurse Practitioner

## 2012-08-07 DIAGNOSIS — Z363 Encounter for antenatal screening for malformations: Secondary | ICD-10-CM | POA: Insufficient documentation

## 2012-08-07 DIAGNOSIS — O99330 Smoking (tobacco) complicating pregnancy, unspecified trimester: Secondary | ICD-10-CM | POA: Insufficient documentation

## 2012-08-07 DIAGNOSIS — Z3689 Encounter for other specified antenatal screening: Secondary | ICD-10-CM

## 2012-08-07 DIAGNOSIS — Z1389 Encounter for screening for other disorder: Secondary | ICD-10-CM | POA: Insufficient documentation

## 2012-08-07 DIAGNOSIS — O358XX0 Maternal care for other (suspected) fetal abnormality and damage, not applicable or unspecified: Secondary | ICD-10-CM | POA: Insufficient documentation

## 2012-08-21 ENCOUNTER — Encounter (HOSPITAL_COMMUNITY): Payer: Self-pay | Admitting: *Deleted

## 2012-08-21 ENCOUNTER — Inpatient Hospital Stay (HOSPITAL_COMMUNITY)
Admission: AD | Admit: 2012-08-21 | Discharge: 2012-08-21 | Disposition: A | Discharge status: Home / Self Care | Payer: Medicaid Other | Source: Ambulatory Visit | Attending: Obstetrics & Gynecology | Admitting: Obstetrics & Gynecology

## 2012-08-21 DIAGNOSIS — K625 Hemorrhage of anus and rectum: Secondary | ICD-10-CM | POA: Insufficient documentation

## 2012-08-21 DIAGNOSIS — K649 Unspecified hemorrhoids: Secondary | ICD-10-CM | POA: Insufficient documentation

## 2012-08-21 DIAGNOSIS — O2242 Hemorrhoids in pregnancy, second trimester: Secondary | ICD-10-CM

## 2012-08-21 DIAGNOSIS — O228X9 Other venous complications in pregnancy, unspecified trimester: Secondary | ICD-10-CM | POA: Insufficient documentation

## 2012-08-21 LAB — CBC
MCH: 30.9 pg (ref 26.0–34.0)
MCHC: 34.3 g/dL (ref 30.0–36.0)
MCV: 90.1 fL (ref 78.0–100.0)
Platelets: 139 10*3/uL — ABNORMAL LOW (ref 150–400)
RDW: 13.5 % (ref 11.5–15.5)

## 2012-08-21 MED ORDER — DOCUSATE SODIUM 100 MG PO CAPS: 100.0000 mg | ORAL_CAPSULE | Freq: Two times a day (BID) | ORAL | Status: DC | PRN

## 2012-08-21 MED ORDER — POLYETHYLENE GLYCOL 3350 17 GM/SCOOP PO POWD: 17.0000 g | Freq: Every day | ORAL | Status: DC

## 2012-08-21 MED ORDER — HYDROCORTISONE 2.5 % RE CREA: TOPICAL_CREAM | Freq: Two times a day (BID) | RECTAL | Status: DC

## 2012-08-21 NOTE — MAU Note (Signed)
On Tues, saw a little blood when strained to use restroom.  Bleeding has continued.  Hx of hemorrhoids.

## 2012-08-21 NOTE — ED Notes (Signed)
Recta exam by Dr. Adriana Simas. External hemorrhoids noted. No active bleeding noted at this time.

## 2012-08-21 NOTE — MAU Provider Note (Signed)
  History     CSN: [id number]  Arrival date and time: 08/21/12 1144   First Provider Initiated Contact with Patient 08/21/12 1247      Chief Complaint  Patient presents with  . Rectal Bleeding   HPI Stephanie Mckay is a 27 y.o. [id number] at [redacted]w[redacted]d who presents with rectal bleeding.  Patient endorses a large amount of rectal bleeding x 3 days. Bleeding occurs with wiping following BM's.  No associated abdominal pain.  Patient does report some straining with BM's. No reports of contractions, vaginal bleeding, or loss of fluid.  *Patient receives her [facility] at the HD.   Past Medical History  Diagnosis Date  . Depression   . Panic attack     Past Surgical History  Procedure Laterality Date  . No past surgeries      No family history on file.  History  Substance Use Topics  . Smoking status: Current Every Day Smoker -- 0.50 packs/day  . Smokeless tobacco: Not on file  . Alcohol Use: No    Allergies:  Allergies  Allergen Reactions  . Vicodin (Hydrocodone-Acetaminophen) Other (See Comments)    Head aches  . Penicillins     Yeast infection    Prescriptions prior to admission  Medication Sig Dispense Refill  . Prenatal Vit-Fe Fumarate-FA (PRENATAL MULTIVITAMIN) TABS Take 1 tablet by mouth daily at 12 noon.      . promethazine (PHENERGAN) 25 MG tablet Take 25 mg by mouth every 6 (six) hours as needed for nausea.      . sertraline (ZOLOFT) 50 MG tablet Take 50 mg by mouth daily.         ROS Per HPI Physical Exam   Blood pressure 111/69, pulse 78, temperature 98.4 F (36.9 C), temperature source Oral, resp. rate 18, height 5' 5.5" (1.664 m), weight 91.173 kg (201 lb), last menstrual period 03/17/2012.  Physical Exam Gen: well appearing, NAD. Heart: RRR Lungs: CTAB. Abd: gravid but otherwise soft, nontender to palpation Ext: no appreciable lower extremity edema bilaterally Rectal: external hemorrhoids noted.  Digital exam did not reveal blood.   Could not perform  FOBT (MAU did not have any cards)     MAU Course  Procedures  MDM CBC, physical exam  Labs:  CBC    Component Value Date/Time   WBC 5.0 08/21/2012 1231   RBC 3.43* 08/21/2012 1231   HGB 10.6* 08/21/2012 1231   HCT 30.9* 08/21/2012 1231   PLT 139* 08/21/2012 1231   MCV 90.1 08/21/2012 1231   MCH 30.9 08/21/2012 1231   MCHC 34.3 08/21/2012 1231   RDW 13.5 08/21/2012 1231   Assessment and Plan  27 y.o. [id number] at [redacted]w[redacted]d who presents with rectal bleeding. - Hb stable.  - Physical exam revealed hemorrhoids.   - Will discharge home with Colace, Miralax, and Anusol.  Everlene Other 08/21/2012, 12:48 PM   I saw and examined patient along with student and agree with above note.   Nastashia Gallo 08/22/2012 6:12 PM

## 2012-11-11 ENCOUNTER — Encounter: Payer: Self-pay | Admitting: *Deleted

## 2012-11-11 DIAGNOSIS — Z302 Encounter for sterilization: Secondary | ICD-10-CM | POA: Insufficient documentation

## 2012-12-13 ENCOUNTER — Inpatient Hospital Stay (HOSPITAL_COMMUNITY)
Admission: AD | Admit: 2012-12-13 | Discharge: 2012-12-16 | DRG: 767 | Disposition: A | Discharge status: Home / Self Care | Payer: Medicaid Other | Source: Ambulatory Visit | Attending: Obstetrics and Gynecology | Admitting: Obstetrics and Gynecology

## 2012-12-13 DIAGNOSIS — F3289 Other specified depressive episodes: Secondary | ICD-10-CM | POA: Diagnosis present

## 2012-12-13 DIAGNOSIS — Z302 Encounter for sterilization: Secondary | ICD-10-CM

## 2012-12-13 DIAGNOSIS — O99344 Other mental disorders complicating childbirth: Secondary | ICD-10-CM | POA: Diagnosis present

## 2012-12-13 DIAGNOSIS — F329 Major depressive disorder, single episode, unspecified: Secondary | ICD-10-CM | POA: Diagnosis present

## 2012-12-13 HISTORY — DX: Papillomavirus as the cause of diseases classified elsewhere: B97.7

## 2012-12-14 ENCOUNTER — Encounter (HOSPITAL_COMMUNITY): Payer: Self-pay | Admitting: *Deleted

## 2012-12-14 ENCOUNTER — Inpatient Hospital Stay (HOSPITAL_COMMUNITY): Payer: Medicaid Other | Admitting: Anesthesiology

## 2012-12-14 ENCOUNTER — Encounter (HOSPITAL_COMMUNITY): Payer: Medicaid Other | Admitting: Anesthesiology

## 2012-12-14 DIAGNOSIS — O99344 Other mental disorders complicating childbirth: Secondary | ICD-10-CM

## 2012-12-14 DIAGNOSIS — F329 Major depressive disorder, single episode, unspecified: Secondary | ICD-10-CM

## 2012-12-14 LAB — CBC
HCT: 30.1 % — ABNORMAL LOW (ref 36.0–46.0)
Hemoglobin: 10.1 g/dL — ABNORMAL LOW (ref 12.0–15.0)
MCH: 29.2 pg (ref 26.0–34.0)
RBC: 3.46 MIL/uL — ABNORMAL LOW (ref 3.87–5.11)
WBC: 7.7 10*3/uL (ref 4.0–10.5)

## 2012-12-14 LAB — RPR: RPR Ser Ql: NONREACTIVE

## 2012-12-14 LAB — TYPE AND SCREEN: ABO/RH(D): O POS

## 2012-12-14 MED ORDER — ONDANSETRON HCL 4 MG PO TABS: 4.0000 mg | ORAL_TABLET | ORAL | Status: DC | PRN

## 2012-12-14 MED ORDER — ZOLPIDEM TARTRATE 5 MG PO TABS: 5.0000 mg | ORAL_TABLET | Freq: Every evening | ORAL | Status: DC | PRN

## 2012-12-14 MED ORDER — FENTANYL 2.5 MCG/ML BUPIVACAINE 1/10 % EPIDURAL INFUSION (WH - ANES)
INTRAMUSCULAR | Status: DC | PRN
  Administered 2012-12-14: 14 mL/h via EPIDURAL

## 2012-12-14 MED ORDER — WITCH HAZEL-GLYCERIN EX PADS: 1.0000 "application " | MEDICATED_PAD | CUTANEOUS | Status: DC | PRN

## 2012-12-14 MED ORDER — LACTATED RINGERS IV SOLN: 500.0000 mL | INTRAVENOUS | Status: DC | PRN

## 2012-12-14 MED ORDER — CITRIC ACID-SODIUM CITRATE 334-500 MG/5ML PO SOLN: 30.0000 mL | ORAL | Status: DC | PRN

## 2012-12-14 MED ORDER — EPHEDRINE 5 MG/ML INJ
10.0000 mg | INTRAVENOUS | Status: DC | PRN
  Filled 2012-12-14: qty 2

## 2012-12-14 MED ORDER — IBUPROFEN 600 MG PO TABS
600.0000 mg | ORAL_TABLET | Freq: Four times a day (QID) | ORAL | Status: DC
  Administered 2012-12-15 – 2012-12-16 (×6): 600 mg via ORAL
  Filled 2012-12-14 (×7): qty 1

## 2012-12-14 MED ORDER — LACTATED RINGERS IV SOLN
INTRAVENOUS | Status: DC
  Administered 2012-12-14 (×2): via INTRAVENOUS

## 2012-12-14 MED ORDER — SIMETHICONE 80 MG PO CHEW
80.0000 mg | CHEWABLE_TABLET | ORAL | Status: DC | PRN
  Administered 2012-12-15: 80 mg via ORAL

## 2012-12-14 MED ORDER — LACTATED RINGERS IV SOLN
500.0000 mL | Freq: Once | INTRAVENOUS | Status: AC
  Administered 2012-12-14: 500 mL via INTRAVENOUS

## 2012-12-14 MED ORDER — PRENATAL MULTIVITAMIN CH
1.0000 | ORAL_TABLET | Freq: Every day | ORAL | Status: DC
  Administered 2012-12-16: 1 via ORAL
  Filled 2012-12-14 (×2): qty 1

## 2012-12-14 MED ORDER — ONDANSETRON HCL 4 MG/2ML IJ SOLN: 4.0000 mg | Freq: Four times a day (QID) | INTRAMUSCULAR | Status: DC | PRN

## 2012-12-14 MED ORDER — FLEET ENEMA 7-19 GM/118ML RE ENEM: 1.0000 | ENEMA | RECTAL | Status: DC | PRN

## 2012-12-14 MED ORDER — NALBUPHINE SYRINGE 5 MG/0.5 ML
5.0000 mg | INJECTION | Freq: Once | INTRAMUSCULAR | Status: DC
  Filled 2012-12-14: qty 0.5

## 2012-12-14 MED ORDER — NALBUPHINE HCL 10 MG/ML IJ SOLN
5.0000 mg | Freq: Once | INTRAMUSCULAR | Status: DC
  Filled 2012-12-14: qty 0.5

## 2012-12-14 MED ORDER — TERBUTALINE SULFATE 1 MG/ML IJ SOLN: 0.2500 mg | Freq: Once | INTRAMUSCULAR | Status: DC | PRN

## 2012-12-14 MED ORDER — SERTRALINE HCL 25 MG PO TABS
25.0000 mg | ORAL_TABLET | Freq: Every day | ORAL | Status: DC
  Administered 2012-12-14 – 2012-12-16 (×3): 25 mg via ORAL
  Filled 2012-12-14 (×3): qty 1

## 2012-12-14 MED ORDER — LANOLIN HYDROUS EX OINT: TOPICAL_OINTMENT | CUTANEOUS | Status: DC | PRN

## 2012-12-14 MED ORDER — LACTATED RINGERS IV SOLN
INTRAVENOUS | Status: DC
  Administered 2012-12-14: 13:00:00 via INTRAUTERINE

## 2012-12-14 MED ORDER — OXYTOCIN BOLUS FROM INFUSION: 500.0000 mL | INTRAVENOUS | Status: DC

## 2012-12-14 MED ORDER — ONDANSETRON HCL 4 MG/2ML IJ SOLN: 4.0000 mg | INTRAMUSCULAR | Status: DC | PRN

## 2012-12-14 MED ORDER — ACETAMINOPHEN 325 MG PO TABS: 650.0000 mg | ORAL_TABLET | ORAL | Status: DC | PRN

## 2012-12-14 MED ORDER — LIDOCAINE HCL (PF) 1 % IJ SOLN
INTRAMUSCULAR | Status: DC | PRN
  Administered 2012-12-14 (×2): 4 mL

## 2012-12-14 MED ORDER — IBUPROFEN 600 MG PO TABS
600.0000 mg | ORAL_TABLET | Freq: Four times a day (QID) | ORAL | Status: DC | PRN
  Administered 2012-12-14: 600 mg via ORAL
  Filled 2012-12-14: qty 1

## 2012-12-14 MED ORDER — DIPHENHYDRAMINE HCL 25 MG PO CAPS: 25.0000 mg | ORAL_CAPSULE | Freq: Four times a day (QID) | ORAL | Status: DC | PRN

## 2012-12-14 MED ORDER — OXYTOCIN 40 UNITS IN LACTATED RINGERS INFUSION - SIMPLE MED
1.0000 m[IU]/min | INTRAVENOUS | Status: DC
  Administered 2012-12-14: 2 m[IU]/min via INTRAVENOUS

## 2012-12-14 MED ORDER — DIBUCAINE 1 % RE OINT: 1.0000 "application " | TOPICAL_OINTMENT | RECTAL | Status: DC | PRN

## 2012-12-14 MED ORDER — DIPHENHYDRAMINE HCL 50 MG/ML IJ SOLN
12.5000 mg | INTRAMUSCULAR | Status: DC | PRN
  Administered 2012-12-14 (×2): 12.5 mg via INTRAVENOUS
  Filled 2012-12-14: qty 1

## 2012-12-14 MED ORDER — LIDOCAINE HCL (PF) 1 % IJ SOLN
30.0000 mL | INTRAMUSCULAR | Status: DC | PRN
  Filled 2012-12-14 (×2): qty 30

## 2012-12-14 MED ORDER — OXYTOCIN 40 UNITS IN LACTATED RINGERS INFUSION - SIMPLE MED
62.5000 mL/h | INTRAVENOUS | Status: DC
  Filled 2012-12-14: qty 1000

## 2012-12-14 MED ORDER — OXYCODONE-ACETAMINOPHEN 5-325 MG PO TABS
1.0000 | ORAL_TABLET | ORAL | Status: DC | PRN
  Administered 2012-12-14 (×2): 1 via ORAL
  Administered 2012-12-15 (×3): 2 via ORAL
  Filled 2012-12-14 (×2): qty 2
  Filled 2012-12-14: qty 1
  Filled 2012-12-14: qty 2
  Filled 2012-12-14: qty 1

## 2012-12-14 MED ORDER — FENTANYL 2.5 MCG/ML BUPIVACAINE 1/10 % EPIDURAL INFUSION (WH - ANES)
14.0000 mL/h | INTRAMUSCULAR | Status: DC | PRN
  Administered 2012-12-14: 14 mL/h via EPIDURAL
  Filled 2012-12-14 (×2): qty 125

## 2012-12-14 MED ORDER — SENNOSIDES-DOCUSATE SODIUM 8.6-50 MG PO TABS
2.0000 | ORAL_TABLET | ORAL | Status: DC
  Administered 2012-12-14 – 2012-12-15 (×2): 2 via ORAL
  Filled 2012-12-14: qty 2
  Filled 2012-12-14: qty 1

## 2012-12-14 MED ORDER — EPHEDRINE 5 MG/ML INJ
10.0000 mg | INTRAVENOUS | Status: DC | PRN
  Filled 2012-12-14: qty 2
  Filled 2012-12-14: qty 4

## 2012-12-14 MED ORDER — HYDROXYZINE HCL 50 MG PO TABS
50.0000 mg | ORAL_TABLET | Freq: Four times a day (QID) | ORAL | Status: DC | PRN
  Filled 2012-12-14: qty 1

## 2012-12-14 MED ORDER — PHENYLEPHRINE 40 MCG/ML (10ML) SYRINGE FOR IV PUSH (FOR BLOOD PRESSURE SUPPORT)
80.0000 ug | PREFILLED_SYRINGE | INTRAVENOUS | Status: DC | PRN
  Filled 2012-12-14: qty 2
  Filled 2012-12-14: qty 5

## 2012-12-14 MED ORDER — FENTANYL CITRATE 0.05 MG/ML IJ SOLN
100.0000 ug | INTRAMUSCULAR | Status: DC | PRN
  Administered 2012-12-14: 100 ug via INTRAVENOUS
  Filled 2012-12-14: qty 2

## 2012-12-14 MED ORDER — PHENYLEPHRINE 40 MCG/ML (10ML) SYRINGE FOR IV PUSH (FOR BLOOD PRESSURE SUPPORT)
80.0000 ug | PREFILLED_SYRINGE | INTRAVENOUS | Status: DC | PRN
  Filled 2012-12-14: qty 2

## 2012-12-14 MED ORDER — BENZOCAINE-MENTHOL 20-0.5 % EX AERO
1.0000 "application " | INHALATION_SPRAY | CUTANEOUS | Status: DC | PRN
  Administered 2012-12-14: 1 via TOPICAL
  Filled 2012-12-14: qty 56

## 2012-12-14 MED ORDER — TETANUS-DIPHTH-ACELL PERTUSSIS 5-2.5-18.5 LF-MCG/0.5 IM SUSP: 0.5000 mL | Freq: Once | INTRAMUSCULAR | Status: DC

## 2012-12-14 NOTE — Progress Notes (Signed)
Stephanie Mckay is a 27 y.o. G5P4004 at [redacted]w[redacted]d admitted for active labor  Subjective: Pain well controlled. Able to feel ctx, no acute issues.  Objective: BP 98/60  Pulse 83  Temp(Src) 97.9 F (36.6 C) (Oral)  Resp 18  Ht 5\' 8"  (1.727 m)  Wt 99.156 kg (218 lb 9.6 oz)  BMI 33.25 kg/m2  SpO2 99%  LMP 03/17/2012   Total I/O In: -  Out: 450 [Urine:450]  FHT:  FHR: 130s bpm, variability: moderate,  accelerations:  Present,  decelerations:  Present variables recurrent. reposisition and fluid bolus UC:   regular, every 3 minutes SVE:   Dilation: 6 Effacement (%): 70 Station: -3 Exam by:: patti moore rn  Labs: Lab Results  Component Value Date   WBC 7.7 12/14/2012   HGB 10.1* 12/14/2012   HCT 30.1* 12/14/2012   MCV 87.0 12/14/2012   PLT 173 12/14/2012    Assessment / Plan: Augmentation of labor, progressing well  Labor: Minimal change s/p SROM, now on pit and continous monitoring Preeclampsia:  no signs or symptoms of toxicity Fetal Wellbeing:  Category II recurrent variables, reposition, fluid bolus, consider amnioinfusion Pain Control:  Epidural I/D:  GBS Neg, no ABx at this time Anticipated MOD:  NSVD  Stephanie Mckay RYAN 12/14/2012, 11:24 AM

## 2012-12-14 NOTE — Anesthesia Preprocedure Evaluation (Addendum)
Anesthesia Evaluation  Patient identified by MRN, date of birth, ID band Patient awake    Reviewed: Allergy & Precautions, H&P , Patient's Chart, lab work & pertinent test results  Airway Mallampati: III TM Distance: >3 FB Neck ROM: Full    Dental no notable dental hx. (+) Teeth Intact   Pulmonary Current Smoker,  breath sounds clear to auscultation  Pulmonary exam normal       Cardiovascular negative cardio ROS  Rhythm:Regular Rate:Normal     Neuro/Psych PSYCHIATRIC DISORDERS Anxiety Depression negative neurological ROS     GI/Hepatic Neg liver ROS, GERD-  Medicated and Controlled,  Endo/Other  Obesity   Renal/GU negative Renal ROS  negative genitourinary   Musculoskeletal negative musculoskeletal ROS (+)   Abdominal   Peds  Hematology negative hematology ROS (+)   Anesthesia Other Findings   Reproductive/Obstetrics (+) Pregnancy Desires Permanent Contraception                          Anesthesia Physical Anesthesia Plan  ASA: II  Anesthesia Plan: Epidural   Post-op Pain Management:    Induction:   Airway Management Planned: Natural Airway  Additional Equipment:   Intra-op Plan:   Post-operative Plan:   Informed Consent: I have reviewed the patients History and Physical, chart, labs and discussed the procedure including the risks, benefits and alternatives for the proposed anesthesia with the patient or authorized representative who has indicated his/her understanding and acceptance.   Dental advisory given  Plan Discussed with: Anesthesiologist  Anesthesia Plan Comments:         Anesthesia Quick Evaluation

## 2012-12-14 NOTE — Progress Notes (Signed)
Stephanie Mckay is a 27 y.o. G5P4004 at [redacted]w[redacted]d admitted for active labor  Subjective: Uncomfortable w/ uc's, just received iv fentanyl, is planning epidural  Objective: BP 114/64  Pulse 83  Temp(Src) 98 F (36.7 C)  Resp 20  Ht 5\' 8"  (1.727 m)  Wt 99.156 kg (218 lb 9.6 oz)  BMI 33.25 kg/m2  LMP 03/17/2012      FHT:  FHR: 125 bpm, variability: moderate,  accelerations:  Present,  decelerations:  Absent UC:   regular, every 2-3 minutes SVE:   Dilation: 5 Effacement (%): 80 Station: -2 Exam by:: [employer], CNM BBOW  Labs: Lab Results  Component Value Date   WBC 7.7 12/14/2012   HGB 10.1* 12/14/2012   HCT 30.1* 12/14/2012   MCV 87.0 12/14/2012   PLT 173 12/14/2012    Assessment / Plan: Spontaneous labor, progressing normally, discussed option of augmenting w/ arom, pt wishes to get epidural first. Will recheck/arom if needed after comfortable w/ epidural  Labor: Progressing normally Preeclampsia:  n/a Fetal Wellbeing:  Category I Pain Control:  Fentanyl and requesting epidural I/D:  n/a Anticipated MOD:  NSVD  Marge Duncans 12/14/2012, 4:56 AM

## 2012-12-14 NOTE — H&P (Signed)
Stephanie Mckay is a 27 y.o. G61P4004 female at [redacted]w[redacted]d by 21wk u/s, presenting for regular uc's. Initially 3/50/-2, repeat exam by RN 4.5/50/-2. Reports good fm, denies vb or lof. Initiated pnc at [facility] at 9wks. +CH early pregnancy, POC neg. Genetic screen neg, ASCUS pap w/ +HRHPV, colpo but not good visualization, recommended pp f/u. Early 1hr glucola 60, repeat 111. Normal anatomy u/s, gbs neg.  H/O uncomplicated term SVD x 4, last birth in 2012. Largest infant weighed 10lb10oz w/o complications. On zoloft for depression.   History OB History   Grav Para Term Preterm Abortions TAB SAB Ect Mult Living   5 4 4       4      Past Medical History  Diagnosis Date  . Depression   . Panic attack   . HPV in female    Past Surgical History  Procedure Laterality Date  . No past surgeries     Family History: family history includes Cancer in her brother and paternal grandfather. Social History:  reports that she has been smoking.  She does not have any smokeless tobacco history on file. She reports that she uses illicit drugs (Marijuana). She reports that she does not drink alcohol.   Review of Systems  Constitutional: Negative.   HENT: Negative.   Eyes: Negative.   Respiratory: Negative.   Cardiovascular: Negative.   Gastrointestinal: Positive for abdominal pain (uc's).  Genitourinary: Negative.   Musculoskeletal: Negative.   Skin: Negative.   Neurological: Negative.   Endo/Heme/Allergies: Negative.   Psychiatric/Behavioral: Negative.     Dilation: 4.5 Effacement (%): 50 Station: -2 Exam by:: Woodroe Chen, RN Blood pressure 112/75, pulse 94, temperature 98 F (36.7 C), resp. rate 20, height 5\' 8"  (1.727 m), weight 99.156 kg (218 lb 9.6 oz), last menstrual period 03/17/2012. Maternal Exam:  Uterine Assessment: Contraction strength is moderate.  Contraction frequency is regular.   Abdomen: Fetal presentation: vertex     Fetal Exam Fetal Monitor Review: Mode: ultrasound.    Baseline rate: 125.  Variability: moderate (6-25 bpm).   Pattern: no decelerations and accelerations present.    Fetal State Assessment: Category I - tracings are normal.     Physical Exam  Constitutional: She is oriented to person, place, and time. She appears well-developed and well-nourished.  HENT:  Head: Normocephalic.  Neck: Normal range of motion.  Cardiovascular: Normal rate and regular rhythm.   Respiratory: Effort normal and breath sounds normal.  GI: Soft. There is no tenderness.  gravid  Genitourinary:  SVE by RN: 3/50/-2, repeat 4.5/50/-2  Musculoskeletal: Normal range of motion.  Neurological: She is alert and oriented to person, place, and time. She has normal reflexes.  Skin: Skin is warm and dry.  Psychiatric: She has a normal mood and affect. Her behavior is normal. Judgment and thought content normal.    Prenatal labs: ABO, Rh:  O+ Antibody:  neg Rubella:  immune RPR:   neg HBsAg:   neg HIV:   neg GBS:   neg  Assessment/Plan: A:   [redacted]w[redacted]d SIUP  [id number]   Labor  Cat I FHR  GBS neg  Depression- on zoloft P:  Admit to BS  IV pain meds/epidural prn  Expectant management  Anticipate NSVD  Plans for pp BTL, consent signed and under media tab in prenatals   Marge Duncans 12/14/2012, 1:46 AM

## 2012-12-14 NOTE — Anesthesia Procedure Notes (Signed)
Epidural Patient location during procedure: OB Start time: 12/14/2012 5:45 AM  Staffing Anesthesiologist: Yonatan Guitron A. Performed by: anesthesiologist   Preanesthetic Checklist Completed: patient identified, site marked, surgical consent, pre-op evaluation, timeout performed, IV checked, risks and benefits discussed and monitors and equipment checked  Epidural Patient position: sitting Prep: site prepped and draped and DuraPrep Patient monitoring: continuous pulse ox and blood pressure Approach: midline Injection technique: LOR air  Needle:  Needle type: Tuohy  Needle gauge: 17 G Needle length: 9 cm and 9 Needle insertion depth: 4 cm Catheter type: closed end flexible Catheter size: 19 Gauge Catheter at skin depth: 9 cm Test dose: negative and Other  Assessment Events: blood not aspirated, injection not painful, no injection resistance, negative IV test and no paresthesia  Additional Notes Patient identified. Risks and benefits discussed including failed block, incomplete  Pain control, post dural puncture headache, nerve damage, paralysis, blood pressure Changes, nausea, vomiting, reactions to medications-both toxic and allergic and post Partum back pain. All questions were answered. Patient expressed understanding and wished to proceed. Sterile technique was used throughout procedure. Epidural site was Dressed with sterile barrier dressing. No paresthesias, signs of intravascular injection Or signs of intrathecal spread were encountered.  Patient was more comfortable after the epidural was dosed. Please see RN's note for documentation of vital signs and FHR which are stable.

## 2012-12-14 NOTE — Progress Notes (Signed)
Spoke with dr Ike Bene - aware that pit if off - iv bolus - position changed

## 2012-12-14 NOTE — Progress Notes (Signed)
LAQUIDA COTRELL is a 27 y.o. G5P4004 at [redacted]w[redacted]d admitted for active labor  Subjective: Comfortable w/ epidural  Objective: BP 104/79  Pulse 80  Temp(Src) 98 F (36.7 C) (Oral)  Resp 16  Ht 5\' 8"  (1.727 m)  Wt 99.156 kg (218 lb 9.6 oz)  BMI 33.25 kg/m2  SpO2 99%  LMP 03/17/2012      FHT:  FHR: 125 bpm, variability: moderate,  accelerations:  Present,  decelerations:  Absent UC:   regular, every 2-5 minutes SVE:   Dilation: 6 Effacement (%): 80 Station: -2 Exam by:: [employer], CNM BBOW AROM large amount clear fluid  Labs: Lab Results  Component Value Date   WBC 7.7 12/14/2012   HGB 10.1* 12/14/2012   HCT 30.1* 12/14/2012   MCV 87.0 12/14/2012   PLT 173 12/14/2012    Assessment / Plan: Spontaneous labor, progressing normally, now arom'd  Labor: Progressing normally Preeclampsia:  n/a Fetal Wellbeing:  Category I Pain Control:  Epidural I/D:  n/a Anticipated MOD:  NSVD  Marge Duncans 12/14/2012, 6:37 AM

## 2012-12-14 NOTE — H&P (Signed)
Chart reviewed and agree with management and plan.  

## 2012-12-14 NOTE — Progress Notes (Signed)
Stephanie Mckay is a 27 y.o. G5P4004 at [redacted]w[redacted]d admitted for active labor  Subjective: Pain moderately well controlled. Able to feel ctx, no acute issues. Recurrent variables not responsive to reposition and bolus  Objective: BP 107/70  Pulse 84  Temp(Src) 98.2 F (36.8 C) (Oral)  Resp 18  Ht 5\' 8"  (1.727 m)  Wt 99.156 kg (218 lb 9.6 oz)  BMI 33.25 kg/m2  SpO2 99%  LMP 03/17/2012   Total I/O In: -  Out: 450 [Urine:450]  FHT:  FHR: 150s bpm, variability: moderate,  accelerations:  Abscent,  decelerations:  Present recurrent variables UC:   regular, every 3 minutes SVE:   Dilation: 6 Effacement (%): 70 Station: -3 Exam by:: patti moore rn  Labs: Lab Results  Component Value Date   WBC 7.7 12/14/2012   HGB 10.1* 12/14/2012   HCT 30.1* 12/14/2012   MCV 87.0 12/14/2012   PLT 173 12/14/2012    Assessment / Plan: augmentation of labor, minimal change. continue pit. IUPC placed for MVU monitoring and amnioinfusion  Labor: Minimal change s/p SROM, now on pit and continous monitoring Preeclampsia:  no signs or symptoms of toxicity Fetal Wellbeing:  Category II persistent variables recurrent. IUPC placed, amnioinfusion at this time. 500cc bolus and 150cc/hr Pain Control:  Epidural I/D:  GBS Neg, no ABx at this time Anticipated MOD:  NSVD  Stephanie Mckay 12/14/2012, 1:01 PM

## 2012-12-14 NOTE — MAU Note (Signed)
Contractions for last couple days. More regular for last couple hours.

## 2012-12-14 NOTE — Anesthesia Postprocedure Evaluation (Signed)
  Anesthesia Post-op Note  Patient: Stephanie Mckay  Procedure(s) Performed: * No procedures listed *  Patient Location: PACU and Mother/Baby  Anesthesia Type:Epidural  Level of Consciousness: awake, alert  and oriented  Airway and Oxygen Therapy: Patient Spontanous Breathing  Post-op Pain: none  Post-op Assessment: Post-op Vital signs reviewed, Patient's Cardiovascular Status Stable, Respiratory Function Stable, Patent Airway, No signs of Nausea or vomiting, Pain level controlled, No headache, No backache, No residual numbness and No residual motor weakness  Post-op Vital Signs: Reviewed and stable  Complications: No apparent anesthesia complications

## 2012-12-15 ENCOUNTER — Encounter (HOSPITAL_COMMUNITY): Payer: Medicaid Other | Admitting: Anesthesiology

## 2012-12-15 ENCOUNTER — Encounter (HOSPITAL_COMMUNITY)
Admission: AD | Disposition: A | Discharge status: Home / Self Care | Payer: Self-pay | Source: Ambulatory Visit | Attending: Obstetrics and Gynecology

## 2012-12-15 ENCOUNTER — Inpatient Hospital Stay (HOSPITAL_COMMUNITY): Payer: Medicaid Other | Admitting: Anesthesiology

## 2012-12-15 DIAGNOSIS — Z302 Encounter for sterilization: Secondary | ICD-10-CM

## 2012-12-15 HISTORY — PX: TUBAL LIGATION: SHX77

## 2012-12-15 LAB — SURGICAL PCR SCREEN
MRSA, PCR: NEGATIVE
Staphylococcus aureus: POSITIVE — AB

## 2012-12-15 LAB — CBC
MCH: 29.4 pg (ref 26.0–34.0)
MCHC: 33.7 g/dL (ref 30.0–36.0)
RDW: 14 % (ref 11.5–15.5)

## 2012-12-15 SURGERY — LIGATION, FALLOPIAN TUBE, POSTPARTUM
Anesthesia: Epidural | Laterality: Bilateral

## 2012-12-15 SURGERY — LIGATION, FALLOPIAN TUBE, POSTPARTUM
Anesthesia: Epidural | Site: Abdomen | Laterality: Bilateral | Wound class: Clean

## 2012-12-15 MED ORDER — CHLOROPROCAINE HCL 1 % IJ SOLN
INTRAMUSCULAR | Status: AC
  Filled 2012-12-15: qty 30

## 2012-12-15 MED ORDER — LIDOCAINE-EPINEPHRINE (PF) 2 %-1:200000 IJ SOLN
INTRAMUSCULAR | Status: AC
  Filled 2012-12-15: qty 20

## 2012-12-15 MED ORDER — IBUPROFEN 600 MG PO TABS: 600.0000 mg | ORAL_TABLET | Freq: Four times a day (QID) | ORAL | Status: DC | PRN

## 2012-12-15 MED ORDER — CHLORHEXIDINE GLUCONATE CLOTH 2 % EX PADS: 6.0000 | MEDICATED_PAD | Freq: Every day | CUTANEOUS | Status: DC

## 2012-12-15 MED ORDER — ONDANSETRON HCL 4 MG/2ML IJ SOLN
INTRAMUSCULAR | Status: DC | PRN
  Administered 2012-12-15: 4 mg via INTRAMUSCULAR

## 2012-12-15 MED ORDER — MEPERIDINE HCL 25 MG/ML IJ SOLN: 6.2500 mg | INTRAMUSCULAR | Status: DC | PRN

## 2012-12-15 MED ORDER — FENTANYL CITRATE 0.05 MG/ML IJ SOLN
25.0000 ug | INTRAMUSCULAR | Status: DC | PRN
Start: 2012-12-15 — End: 2012-12-16
  Administered 2012-12-15 (×2): 50 ug via INTRAVENOUS

## 2012-12-15 MED ORDER — OXYCODONE HCL 5 MG PO TABS
5.0000 mg | ORAL_TABLET | ORAL | Status: DC | PRN
  Administered 2012-12-15 – 2012-12-16 (×3): 5 mg via ORAL
  Filled 2012-12-15 (×3): qty 1

## 2012-12-15 MED ORDER — SODIUM BICARBONATE 8.4 % IV SOLN
INTRAVENOUS | Status: AC
  Filled 2012-12-15: qty 50

## 2012-12-15 MED ORDER — METOCLOPRAMIDE HCL 5 MG/ML IJ SOLN: 10.0000 mg | Freq: Once | INTRAMUSCULAR | Status: AC | PRN

## 2012-12-15 MED ORDER — FENTANYL CITRATE 0.05 MG/ML IJ SOLN
INTRAMUSCULAR | Status: AC
  Filled 2012-12-15: qty 2

## 2012-12-15 MED ORDER — MIDAZOLAM HCL 2 MG/2ML IJ SOLN
INTRAMUSCULAR | Status: AC
  Filled 2012-12-15: qty 2

## 2012-12-15 MED ORDER — FAMOTIDINE 20 MG PO TABS
40.0000 mg | ORAL_TABLET | Freq: Once | ORAL | Status: AC
  Administered 2012-12-15: 20 mg via ORAL
  Filled 2012-12-15: qty 2

## 2012-12-15 MED ORDER — FENTANYL CITRATE 0.05 MG/ML IJ SOLN
INTRAMUSCULAR | Status: DC | PRN
  Administered 2012-12-15 (×2): 50 ug via INTRAVENOUS

## 2012-12-15 MED ORDER — BUPIVACAINE HCL (PF) 0.5 % IJ SOLN
INTRAMUSCULAR | Status: AC
  Filled 2012-12-15: qty 30

## 2012-12-15 MED ORDER — MUPIROCIN 2 % EX OINT
1.0000 "application " | TOPICAL_OINTMENT | Freq: Two times a day (BID) | CUTANEOUS | Status: DC
  Administered 2012-12-15: 1 via NASAL
  Filled 2012-12-15: qty 22

## 2012-12-15 MED ORDER — BUPIVACAINE HCL (PF) 0.5 % IJ SOLN
INTRAMUSCULAR | Status: DC | PRN
  Administered 2012-12-15: 15 mg

## 2012-12-15 MED ORDER — 0.9 % SODIUM CHLORIDE (POUR BTL) OPTIME
TOPICAL | Status: DC | PRN
  Administered 2012-12-15: 1000 mL

## 2012-12-15 MED ORDER — ONDANSETRON HCL 4 MG/2ML IJ SOLN
INTRAMUSCULAR | Status: AC
  Filled 2012-12-15: qty 2

## 2012-12-15 MED ORDER — CHLOROPROCAINE HCL 3 % IJ SOLN
INTRAMUSCULAR | Status: DC | PRN
  Administered 2012-12-15: 5 mL

## 2012-12-15 MED ORDER — OXYCODONE HCL 5 MG PO TABS
5.0000 mg | ORAL_TABLET | Freq: Once | ORAL | Status: AC
  Administered 2012-12-15: 5 mg via ORAL
  Filled 2012-12-15: qty 1

## 2012-12-15 MED ORDER — MIDAZOLAM HCL 2 MG/2ML IJ SOLN
INTRAMUSCULAR | Status: DC | PRN
  Administered 2012-12-15: 2 mg via INTRAVENOUS

## 2012-12-15 MED ORDER — BUPIVACAINE HCL (PF) 0.75 % IJ SOLN
INTRAMUSCULAR | Status: DC | PRN
  Administered 2012-12-15: 1 mL via INTRATHECAL

## 2012-12-15 MED ORDER — LACTATED RINGERS IV SOLN
INTRAVENOUS | Status: DC
  Administered 2012-12-15: 09:00:00 via INTRAVENOUS

## 2012-12-15 MED ORDER — METOCLOPRAMIDE HCL 10 MG PO TABS
10.0000 mg | ORAL_TABLET | Freq: Once | ORAL | Status: AC
  Administered 2012-12-15: 10 mg via ORAL
  Filled 2012-12-15: qty 1

## 2012-12-15 MED ORDER — SODIUM BICARBONATE 8.4 % IV SOLN
INTRAVENOUS | Status: DC | PRN
  Administered 2012-12-15 (×4): 5 mL via EPIDURAL

## 2012-12-15 MED ORDER — CHLOROPROCAINE HCL 3 % IJ SOLN
INTRAMUSCULAR | Status: AC
  Filled 2012-12-15: qty 20

## 2012-12-15 SURGICAL SUPPLY — 14 items
CLOTH BEACON ORANGE TIMEOUT ST (SAFETY) IMPLANT
DURAPREP 26ML APPLICATOR (WOUND CARE) ×2 IMPLANT
GLOVE BIO SURGEON STRL SZ 6.5 (GLOVE) IMPLANT
GOWN PREVENTION PLUS LG XLONG (DISPOSABLE) ×2 IMPLANT
NS IRRIG 1000ML POUR BTL (IV SOLUTION) IMPLANT
PACK ABDOMINAL MINOR (CUSTOM PROCEDURE TRAY) IMPLANT
STRIP CLOSURE SKIN 1/4X3 (GAUZE/BANDAGES/DRESSINGS) IMPLANT
SUT CHROMIC 0 CT 1 (SUTURE) IMPLANT
SUT VIC AB 0 CT1 27 (SUTURE)
SUT VIC AB 0 CT1 27XBRD ANBCTR (SUTURE) ×1 IMPLANT
SUT VICRYL 4-0 PS2 18IN ABS (SUTURE) IMPLANT
TOWEL OR 17X24 6PK STRL BLUE (TOWEL DISPOSABLE) ×2 IMPLANT
TRAY FOLEY CATH 14FR (SET/KITS/TRAYS/PACK) ×1 IMPLANT
WATER STERILE IRR 1000ML POUR (IV SOLUTION) ×1 IMPLANT

## 2012-12-15 SURGICAL SUPPLY — 17 items
CLIP FILSHIE TUBAL LIGA STRL (Clip) ×2 IMPLANT
CLOTH BEACON ORANGE TIMEOUT ST (SAFETY) ×2 IMPLANT
DRAPE LAPAROTOMY TRNSV 102X78 (DRAPE) ×2 IMPLANT
DRSG TEGADERM 1.75X1.75 (GAUZE/BANDAGES/DRESSINGS) ×1 IMPLANT
DURAPREP 26ML APPLICATOR (WOUND CARE) ×4 IMPLANT
GLOVE BIO SURGEON STRL SZ 6.5 (GLOVE) ×4 IMPLANT
GOWN PREVENTION PLUS LG XLONG (DISPOSABLE) ×7 IMPLANT
NS IRRIG 1000ML POUR BTL (IV SOLUTION) ×2 IMPLANT
PACK ABDOMINAL MINOR (CUSTOM PROCEDURE TRAY) ×2 IMPLANT
STRIP CLOSURE SKIN 1/4X3 (GAUZE/BANDAGES/DRESSINGS) ×2 IMPLANT
SUT CHROMIC 0 CT 1 (SUTURE) IMPLANT
SUT VIC AB 0 CT1 27 (SUTURE) ×2
SUT VIC AB 0 CT1 27XBRD ANBCTR (SUTURE) ×1 IMPLANT
SUT VICRYL 4-0 PS2 18IN ABS (SUTURE) ×2 IMPLANT
TOWEL OR 17X24 6PK STRL BLUE (TOWEL DISPOSABLE) ×6 IMPLANT
TRAY FOLEY CATH 14FR (SET/KITS/TRAYS/PACK) ×2 IMPLANT
WATER STERILE IRR 1000ML POUR (IV SOLUTION) ×2 IMPLANT

## 2012-12-15 NOTE — Progress Notes (Addendum)
Post Partum Day 1, scheduled for pp BTL today at 9:30 Subjective: no complaints, up ad lib, voiding and NPO confirmed with patient. Epidural in place.  Objective: Blood pressure 100/68, pulse 80, temperature 98.4 F (36.9 C), temperature source Oral, resp. rate 18, height 5\' 8"  (1.727 m), weight 99.156 kg (218 lb 9.6 oz), last menstrual period 03/17/2012, SpO2 99.00%, unknown if currently breastfeeding.  Physical Exam:  General: alert, cooperative and no distress Lochia: appropriate Uterine Fundus: soft U+ 1. Firms up with palpation. Incision:  DVT Evaluation: No evidence of DVT seen on physical exam.   Recent Labs  12/14/12 0230  HGB 10.1*  HCT 30.1*  CBC ordered this a.m.  Assessment/Plan: Contraception BTL this morning. Medicaid consent present in Media section, over 51 days old Patient understands permanency of requested procedure, no    LOS: 2 days   Risa Auman V 12/15/2012, 7:53 AM

## 2012-12-15 NOTE — Transfer of Care (Signed)
Immediate Anesthesia Transfer of Care Note  Patient: Stephanie Mckay  Procedure(s) Performed: Procedure(s): POST PARTUM TUBAL LIGATION (Bilateral)  Patient Location: PACU  Anesthesia Type:Spinal  Level of Consciousness: sedated  Airway & Oxygen Therapy: Patient Spontanous Breathing  Post-op Assessment: Report given to PACU RN  Post vital signs: Reviewed and stable  Complications: No apparent anesthesia complications

## 2012-12-15 NOTE — Op Note (Signed)
12/13/2012 - 12/15/2012  10:45 AM  PATIENT:  Stephanie Mckay  26 y.o. female  PRE-OPERATIVE DIAGNOSIS:  desire sterilization  POST-OPERATIVE DIAGNOSIS:  desire sterilization  PROCEDURE:  Procedure(s): POST PARTUM TUBAL LIGATION (Bilateral)  SURGEON:  Surgeon(s) and Role:    * Allie Bossier, MD - Primary  PHYSICIAN ASSISTANT:   ASSISTANTS: Rulon Abide, MD   ANESTHESIA:   spinal  EBL:  Total I/O In: 700 [I.V.:700] Out: -   BLOOD ADMINISTERED:none  DRAINS: none   LOCAL MEDICATIONS USED:  MARCAINE     SPECIMEN:  No Specimen  DISPOSITION OF SPECIMEN:  N/A  COUNTS:  YES  TOURNIQUET:  * No tourniquets in log *  DICTATION: .Dragon Dictation  PLAN OF CARE: Admit to inpatient   PATIENT DISPOSITION:  PACU - hemodynamically stable.   Delay start of Pharmacological VTE agent (>24hrs) due to surgical blood loss or risk of bleeding: not applicable  The risks, benefits, and alternatives of surgery were explained, understood, and accepted. I discussed the failure rate of a tubal ligation of up to one in 200 people. She accepts all risks of surgery and wishes to proceed. Consents were signed, and all questions were answered. In the operating room her epidural was bolused for surgery. It was not adequate for surgery and so a spinal was given. After adequate anesthesia was assured,  her abdomen was prepped with ChloraPrep. 15 mL of 0.5% Marcaine were injected into the subcutaneous tissue at the umbilicus. A vertical umbilical incision was made. The fascia was elevated with Coker clamps, and fascial incision was made with Mayo scissors. The peritoneum was entered with hemostats. I used retractors to visualize the oviduct on the right. It was grasped with a Babcock clamp and traced to its fimbriated end. It was then retraced to the isthmic region where a post clip was placed across the entire tube. No bleeding was noted.  The same procedure was done on the other side. Both oviducts appeared  normal. The fascia was then elevated with Coker clamps and closed with a 0 Vicryl running nonlocking suture. No defects were palpable. The subcuticular closure was done with 4-0 Vicryl suture excellent cosmetic results were obtained. She tolerated the procedure well. She was taken to recovery room in stable condition.

## 2012-12-15 NOTE — Progress Notes (Signed)
Ur chart review completed.  

## 2012-12-15 NOTE — Anesthesia Postprocedure Evaluation (Signed)
  Anesthesia Post-op Note  Patient: Stephanie Mckay  Procedure(s) Performed: Procedure(s): POST PARTUM TUBAL LIGATION (Bilateral)  Patient Location: Women's Unit  Anesthesia Type:Spinal  Level of Consciousness: awake, alert  and oriented  Airway and Oxygen Therapy: Patient Spontanous Breathing  Post-op Pain: none  Post-op Assessment: Post-op Vital signs reviewed, Patient's Cardiovascular Status Stable, No headache, No backache, No residual numbness and No residual motor weakness  Post-op Vital Signs: Reviewed and stable  Complications: No apparent anesthesia complications

## 2012-12-15 NOTE — Progress Notes (Signed)
Patient back from PACU after a tubal.  States she feels "mild" pain and is comfortable at this time.  Verified that her diet was regular in the computer and patient is now ordering her meal.

## 2012-12-15 NOTE — Anesthesia Preprocedure Evaluation (Signed)
Anesthesia Evaluation  Patient identified by MRN, date of birth, ID band Patient awake    Reviewed: Allergy & Precautions, H&P , Patient's Chart, lab work & pertinent test results  Airway Mallampati: III TM Distance: >3 FB Neck ROM: Full    Dental  (+)    Pulmonary Recent URI  (reports she has a cold), Current Smoker,  breath sounds clear to auscultation  Pulmonary exam normal       Cardiovascular negative cardio ROS  Rhythm:Regular Rate:Normal     Neuro/Psych PSYCHIATRIC DISORDERS (depression, panic attacks) Anxiety Depression negative neurological ROS     GI/Hepatic Neg liver ROS, GERD-  Medicated and Controlled,(+)       marijuana use,   Endo/Other  Obesity   Renal/GU negative Renal ROS  negative genitourinary   Musculoskeletal negative musculoskeletal ROS (+)   Abdominal   Peds  Hematology  (+) anemia , plt 146   Anesthesia Other Findings Reports epidural was one sided for labor - was able to get comfortable with high doses.  Plan to pull back 2 cm and dose prior to OR to try to obtain bilateral level.  Reproductive/Obstetrics (+) Pregnancy (s/p SVD on 12/14/12, for PPBTL) Desires Permanent Contraception                           Anesthesia Physical  Anesthesia Plan  ASA: II  Anesthesia Plan: Epidural   Post-op Pain Management:    Induction:   Airway Management Planned: Natural Airway  Additional Equipment:   Intra-op Plan:   Post-operative Plan:   Informed Consent: I have reviewed the patients History and Physical, chart, labs and discussed the procedure including the risks, benefits and alternatives for the proposed anesthesia with the patient or authorized representative who has indicated his/her understanding and acceptance.   Dental advisory given  Plan Discussed with: Anesthesiologist, Surgeon and CRNA  Anesthesia Plan Comments:         Anesthesia Quick  Evaluation

## 2012-12-15 NOTE — Preoperative (Signed)
Beta Blockers   Reason not to administer Beta Blockers:Not Applicable 

## 2012-12-15 NOTE — Anesthesia Procedure Notes (Signed)
Spinal  Patient location during procedure: OR Start time: 12/15/2012 10:13 AM Staffing Anesthesiologist: Angus Seller., Harrell Gave. Performed by: anesthesiologist  Preanesthetic Checklist Completed: patient identified, site marked, surgical consent, pre-op evaluation, timeout performed, IV checked, risks and benefits discussed and monitors and equipment checked Spinal Block Patient position: sitting Prep: DuraPrep Patient monitoring: heart rate, cardiac monitor, continuous pulse ox and blood pressure Approach: midline Location: L3-4 Injection technique: single-shot Needle Needle type: Sprotte  Needle gauge: 24 G Needle length: 9 cm Assessment Sensory level: T4 Additional Notes Patient identified.  Risk benefits discussed including failed block, incomplete pain control, headache, nerve damage, paralysis, blood pressure changes, nausea, vomiting, reactions to medication both toxic or allergic, and postpartum back pain.  Patient expressed understanding and wished to proceed.  All questions were answered.  Sterile technique used throughout procedure.  CSF was clear.  No parasthesia or other complications.  Please see nursing notes for vital signs.

## 2012-12-15 NOTE — Progress Notes (Signed)
Patient to [facility] in Short Stay.  Consent and 30-day papers signed.  IV patent. NPO since midnight

## 2012-12-15 NOTE — Progress Notes (Signed)

## 2012-12-15 NOTE — Anesthesia Postprocedure Evaluation (Signed)
  Anesthesia Post Note  Patient: Stephanie Mckay  Procedure(s) Performed: Procedure(s) (LRB): POST PARTUM TUBAL LIGATION (Bilateral)  Anesthesia type: Spinal and epidural  Patient location: PACU  Post pain: Pain level controlled  Post assessment: Post-op Vital signs reviewed  Last Vitals:  Filed Vitals:   12/15/12 1100  BP: 90/62  Pulse: 76  Temp:   Resp: 14    Post vital signs: Reviewed  Level of consciousness: awake  Complications: No apparent anesthesia complications

## 2012-12-16 ENCOUNTER — Encounter (HOSPITAL_COMMUNITY): Payer: Self-pay | Admitting: Obstetrics & Gynecology

## 2012-12-16 MED ORDER — OXYCODONE-ACETAMINOPHEN 5-325 MG PO TABS: 1.0000 | ORAL_TABLET | ORAL | Status: DC | PRN

## 2012-12-16 MED ORDER — OXYCODONE HCL 5 MG PO TABS: 5.0000 mg | ORAL_TABLET | ORAL | Status: DC | PRN

## 2012-12-16 MED ORDER — IBUPROFEN 600 MG PO TABS: 600.0000 mg | ORAL_TABLET | Freq: Four times a day (QID) | ORAL | Status: DC

## 2012-12-16 NOTE — Discharge Summary (Signed)
Obstetric Discharge Summary Reason for Admission: onset of labor Prenatal Procedures: none Intrapartum Procedures: spontaneous vaginal delivery Postpartum Procedures: none Complications-Operative and Postpartum: labial laceration Hemoglobin  Date Value Range Status  12/15/2012 8.5* 12.0 - 15.0 g/dL Final     HCT  Date Value Range Status  12/15/2012 25.2* 36.0 - 46.0 % Final    Physical Exam:  General: alert, cooperative and no distress Lochia: appropriate Uterine Fundus: firm, at umbilicus  Incision: healing well, no significant drainage, no significant erythema, tender to light palpation DVT Evaluation: No evidence of DVT seen on physical exam.  Discharge Diagnoses: Term Pregnancy-delivered  Discharge Information: Date: 12/16/2012 Activity: unrestricted Diet: routine Medications: Ibuprofen and oxycodone Condition: stable Instructions: refer to practice specific booklet Discharge to: home Follow-up Information   Schedule an appointment as soon as possible for a visit with [facility] [place]. (for 4-6 weeks postpartum visit)    Contact information:   [address] [place] [address] [phone]      Newborn Data: Live born female  Birth Weight: 7 lb 10.2 oz (3465 g) APGAR: 8, 9  Home with mother. Ms Revels is a 27y.o [id number] who presented at 39w1 in active labor. Pt progressed normally, was AROM'd, placed on pit for augmentation. Fetal monitoring significant for reccurrent variables, which required repositioning and fluid bolus. IUPC was placed and amnioinfusion was started 500cc bolus followed by 150cc/hr. Pt delivered a viable female with instant cry. Delivery was complicated by labial tear which was repaired by 4.0 vicryl. EBL approx 300. Mother and baby both hemodynamically stable for postpartum. PPD#1 pt was scheduled for BTL. Pt tolerated procedure well c/o pain at site of umbilical incision. Pain continued to improve throughout pt's hospital  course. She was tolerating PO well, ambulating without difficulty and passing flatus. SW was consulted given hx of depression but felt mom to be stable on current dose of zoloft. Pt will attempt to breast feed, with bottle supplement. Expressed some concerns to me this morning re strabismus. I instructed her to f/up at her peds apptmt abt this concern, but reassured that this can often be normal in neonates. Will have her postpartum visit in 4-6 weeks at the HD.  Anselm Lis 12/16/2012, 6:41 AM  I have seen and examined this patient and agree with above documentation in the resident's note.   Rulon Abide, M.D. [facility] Fellow 12/16/2012 7:58 AM

## 2013-01-09 ENCOUNTER — Encounter: Payer: Self-pay | Admitting: *Deleted

## 2013-05-04 ENCOUNTER — Encounter (HOSPITAL_COMMUNITY): Payer: Self-pay | Admitting: Emergency Medicine

## 2013-05-04 ENCOUNTER — Emergency Department (HOSPITAL_COMMUNITY)
Admission: EM | Admit: 2013-05-04 | Discharge: 2013-05-04 | Disposition: A | Discharge status: Home / Self Care | Payer: Medicaid Other | Attending: Emergency Medicine | Admitting: Emergency Medicine

## 2013-05-04 DIAGNOSIS — Z8659 Personal history of other mental and behavioral disorders: Secondary | ICD-10-CM | POA: Insufficient documentation

## 2013-05-04 DIAGNOSIS — A5400 Gonococcal infection of lower genitourinary tract, unspecified: Secondary | ICD-10-CM | POA: Insufficient documentation

## 2013-05-04 DIAGNOSIS — Z3202 Encounter for pregnancy test, result negative: Secondary | ICD-10-CM | POA: Insufficient documentation

## 2013-05-04 DIAGNOSIS — Z202 Contact with and (suspected) exposure to infections with a predominantly sexual mode of transmission: Secondary | ICD-10-CM

## 2013-05-04 DIAGNOSIS — Z880 Allergy status to penicillin: Secondary | ICD-10-CM | POA: Insufficient documentation

## 2013-05-04 DIAGNOSIS — F17200 Nicotine dependence, unspecified, uncomplicated: Secondary | ICD-10-CM | POA: Insufficient documentation

## 2013-05-04 LAB — URINE MICROSCOPIC-ADD ON

## 2013-05-04 LAB — WET PREP, GENITAL
Trich, Wet Prep: NONE SEEN
YEAST WET PREP: NONE SEEN

## 2013-05-04 LAB — URINALYSIS, ROUTINE W REFLEX MICROSCOPIC
Bilirubin Urine: NEGATIVE
GLUCOSE, UA: NEGATIVE mg/dL
HGB URINE DIPSTICK: NEGATIVE
KETONES UR: 15 mg/dL — AB
Nitrite: NEGATIVE
PROTEIN: NEGATIVE mg/dL
Specific Gravity, Urine: 1.031 — ABNORMAL HIGH (ref 1.005–1.030)
Urobilinogen, UA: 1 mg/dL (ref 0.0–1.0)
pH: 6.5 (ref 5.0–8.0)

## 2013-05-04 LAB — POC URINE PREG, ED: Preg Test, Ur: NEGATIVE

## 2013-05-04 MED ORDER — DOXYCYCLINE HYCLATE 100 MG PO CAPS: 100.0000 mg | ORAL_CAPSULE | Freq: Two times a day (BID) | ORAL | Status: DC

## 2013-05-04 MED ORDER — FLUCONAZOLE 150 MG PO TABS: 150.0000 mg | ORAL_TABLET | Freq: Once | ORAL | Status: AC

## 2013-05-04 MED ORDER — CEFTRIAXONE SODIUM 250 MG IJ SOLR
250.0000 mg | Freq: Once | INTRAMUSCULAR | Status: AC
  Administered 2013-05-04: 250 mg via INTRAMUSCULAR
  Filled 2013-05-04: qty 250

## 2013-05-04 MED ORDER — LIDOCAINE HCL (PF) 1 % IJ SOLN
INTRAMUSCULAR | Status: AC
  Administered 2013-05-04: 5 mL
  Filled 2013-05-04: qty 5

## 2013-05-04 MED ORDER — METRONIDAZOLE 500 MG PO TABS: 500.0000 mg | ORAL_TABLET | Freq: Two times a day (BID) | ORAL | Status: DC

## 2013-05-04 NOTE — ED Notes (Signed)
Ambulates without distress.

## 2013-05-04 NOTE — ED Provider Notes (Signed)
CSN: [id number]     Arrival date & time 05/04/13  1408 History   First MD Initiated Contact with Patient 05/04/13 1612     Chief Complaint  Patient presents with  . SEXUALLY TRANSMITTED DISEASE     (Consider location/radiation/quality/duration/timing/severity/associated sxs/prior Treatment) The history is provided by the patient.   patient presents with a partner who was gonorrhea positive. She states that her boyfriend went to the doctor today and was told had gonorrhea. She states she's had a mild thickening of her vaginal discharge, a bolus of symptoms. No vaginal pain. She states her discharge is white. She states she is 4 months postpartum. No dysuria.  Past Medical History  Diagnosis Date  . Depression   . Panic attack   . HPV in female    Past Surgical History  Procedure Laterality Date  . No past surgeries    . Tubal ligation Bilateral 12/15/2012    Procedure: POST PARTUM TUBAL LIGATION;  Surgeon: Allie BossierMyra C Dove, MD;  Location: [facility];  Service: Gynecology;  Laterality: Bilateral;   Family History  Problem Relation Age of Onset  . Cancer Brother   . Cancer Paternal Grandfather    History  Substance Use Topics  . Smoking status: Current Every Day Smoker -- 0.50 packs/day    Types: Cigarettes  . Smokeless tobacco: Not on file  . Alcohol Use: No   OB History   Grav Para Term Preterm Abortions TAB SAB Ect Mult Living   5 5 5       5      Review of Systems  Constitutional: Negative for fever and chills.  Gastrointestinal: Negative for nausea, vomiting and abdominal pain.  Genitourinary: Positive for vaginal discharge. Negative for frequency, flank pain, decreased urine volume, vaginal bleeding, vaginal pain and menstrual problem.  Skin: Negative for rash.      Allergies  Vicodin and Penicillins  Home Medications  No current outpatient prescriptions on file. BP 126/77  Pulse 70  Temp(Src) 98.1 F (36.7 C) (Oral)  Resp 18  SpO2 98% Physical Exam   Constitutional: She appears well-developed and well-nourished.  Abdominal: There is no tenderness.  Genitourinary:  Pelvic exam shows some thick white vaginal discharge. No cervical motion tenderness. No adnexal tenderness no rash externally.    ED Course  Procedures (including critical care time) Labs Review Labs Reviewed  URINALYSIS, ROUTINE W REFLEX MICROSCOPIC - Abnormal; Notable for the following:    APPearance HAZY (*)    Specific Gravity, Urine 1.031 (*)    Ketones, ur 15 (*)    Leukocytes, UA TRACE (*)    All other components within normal limits  GC/CHLAMYDIA PROBE AMP  WET PREP, GENITAL  RPR  HIV ANTIBODY (ROUTINE TESTING)  URINE MICROSCOPIC-ADD ON  POC URINE PREG, ED   Imaging Review No results found.   EKG Interpretation None      MDM   Final diagnoses:  None    Patient with possible exposure to gonorrhea. We'll treat. STD panel sent.    Juliet RudeNathan R. Rubin PayorPickering, MD 05/04/13 1728

## 2013-05-04 NOTE — ED Notes (Signed)
Pt presents to department for evaluation of possible STD. States partner was recently exposed, wants to be tested today. States increased vaginal discharge with foul smell.

## 2013-05-04 NOTE — Discharge Instructions (Signed)
Vaginitis Vaginitis is an inflammation of the vagina. It is most often caused by a change in the normal balance of the bacteria and yeast that live in the vagina. This change in balance causes an overgrowth of certain bacteria or yeast, which causes the inflammation. There are different types of vaginitis, but the most common types are:  Bacterial vaginosis.  Yeast infection (candidiasis).  Trichomoniasis vaginitis. This is a sexually transmitted infection (STI).  Viral vaginitis.  Atropic vaginitis.  Allergic vaginitis. CAUSES  The cause depends on the type of vaginitis. Vaginitis can be caused by:  Bacteria (bacterial vaginosis).  Yeast (yeast infection).  A parasite (trichomoniasis vaginitis)  A virus (viral vaginitis).  Low hormone levels (atrophic vaginitis). Low hormone levels can occur during pregnancy, breastfeeding, or after menopause.  Irritants, such as bubble baths, scented tampons, and feminine sprays (allergic vaginitis). Other factors can change the normal balance of the yeast and bacteria that live in the vagina. These include:  Antibiotic medicines.  Poor hygiene.  Diaphragms, vaginal sponges, spermicides, birth control pills, and intrauterine devices (IUD).  Sexual intercourse.  Infection.  Uncontrolled diabetes.  A weakened immune system. SYMPTOMS  Symptoms can vary depending on the cause of the vaginitis. Common symptoms include:  Abnormal vaginal discharge.  The discharge is white, gray, or yellow with bacterial vaginosis.  The discharge is thick, white, and cheesy with a yeast infection.  The discharge is frothy and yellow or greenish with trichomoniasis.  A bad vaginal odor.  The odor is fishy with bacterial vaginosis.  Vaginal itching, pain, or swelling.  Painful intercourse.  Pain or burning when urinating. Sometimes, there are no symptoms. TREATMENT  Treatment will vary depending on the type of infection.   Bacterial  vaginosis and trichomoniasis are often treated with antibiotic creams or pills.  Yeast infections are often treated with antifungal medicines, such as vaginal creams or suppositories.  Viral vaginitis has no cure, but symptoms can be treated with medicines that relieve discomfort. Your sexual partner should be treated as well.  Atrophic vaginitis may be treated with an estrogen cream, pill, suppository, or vaginal ring. If vaginal dryness occurs, lubricants and moisturizing creams may help. You may be told to avoid scented soaps, sprays, or douches.  Allergic vaginitis treatment involves quitting the use of the product that is causing the problem. Vaginal creams can be used to treat the symptoms. HOME CARE INSTRUCTIONS   Take all medicines as directed by your caregiver.  Keep your genital area clean and dry. Avoid soap and only rinse the area with water.  Avoid douching. It can remove the healthy bacteria in the vagina.  Do not use tampons or have sexual intercourse until your vaginitis has been treated. Use sanitary pads while you have vaginitis.  Wipe from front to back. This avoids the spread of bacteria from the rectum to the vagina.  Let air reach your genital area.  Wear cotton underwear to decrease moisture buildup.  Avoid wearing underwear while you sleep until your vaginitis is gone.  Avoid tight pants and underwear or nylons without a cotton panel.  Take off wet clothing (especially bathing suits) as soon as possible.  Use mild, non-scented products. Avoid using irritants, such as:  Scented feminine sprays.  Fabric softeners.  Scented detergents.  Scented tampons.  Scented soaps or bubble baths.  Practice safe sex and use condoms. Condoms may prevent the spread of trichomoniasis and viral vaginitis. SEEK MEDICAL CARE IF:   You have abdominal pain.  You  have a fever or persistent symptoms for more than 2 3 days.  You have a fever and your symptoms suddenly  get worse. Document Released: 12/10/2006 Document Revised: 11/07/2011 Document Reviewed: 07/26/2011 [facility] Patient Information 2014 [place], [place].  Sexually Transmitted Disease A sexually transmitted disease (STD) is a disease or infection that may be passed (transmitted) from person to person, usually during sexual activity. This may happen by way of saliva, semen, blood, vaginal mucus, or urine. Common STDs include:   Gonorrhea.   Chlamydia.   Syphilis.   HIV and AIDS.   Genital herpes.   Hepatitis B and C.   Trichomonas.   Human papillomavirus (HPV).   Pubic lice.   Scabies.  Mites.  Bacterial vaginosis. WHAT ARE CAUSES OF STDs? An STD may be caused by bacteria, a virus, or parasites. STDs are often transmitted during sexual activity if one person is infected. However, they may also be transmitted through nonsexual means. STDs may be transmitted after:   Sexual intercourse with an infected person.   Sharing sex toys with an infected person.   Sharing needles with an infected person or using unclean piercing or tattoo needles.  Having intimate contact with the genitals, mouth, or rectal areas of an infected person.   Exposure to infected fluids during birth. WHAT ARE THE SIGNS AND SYMPTOMS OF STDs? Different STDs have different symptoms. Some people may not have any symptoms. If symptoms are present, they may include:   Painful or bloody urination.   Pain in the pelvis, abdomen, vagina, anus, throat, or eyes.   Skin rash, itching, irritation, growths, sores (lesions), ulcerations, or warts in the genital or anal area.  Abnormal vaginal discharge with or without bad odor.   Penile discharge in men.   Fever.   Pain or bleeding during sexual intercourse.   Swollen glands in the groin area.   Yellow skin and eyes (jaundice). This is seen with hepatitis.   Swollen testicles.  Infertility.  Sores and blisters in the mouth. HOW  ARE STDs DIAGNOSED? To make a diagnosis, your health care provider may:   Take a medical history.   Perform a physical exam.   Take a sample of any discharge for examination.  Swab the throat, cervix, opening to the penis, rectum, or vagina for testing.  Test a sample of your first morning urine.   Perform blood tests.   Perform a Pap smear, if this applies.   Perform a colposcopy.   Perform a laparoscopy.  HOW ARE STDs TREATED? Treatment depends on the STD. Some STDs may be treated but not cured.   Chlamydia, gonorrhea, trichomonas, and syphilis can be cured with antibiotics.   Genital herpes, hepatitis, and HIV can be treated, but not cured, with prescribed medicines. The medicines lessen symptoms.   Genital warts from HPV can be treated with medicine or by freezing, burning (electrocautery), or surgery. Warts may come back.   HPV cannot be cured with medicine or surgery. However, abnormal areas may be removed from the cervix, vagina, or vulva.   If your diagnosis is confirmed, your recent sexual partners need treatment. This is true even if they are symptom-free or have a negative culture or evaluation. They should not have sex until their health care providers say it is OK. HOW CAN I REDUCE MY RISK OF GETTING AN STD?  Use latex condoms, dental dams, and water-soluble lubricants during sexual activity. Do not use petroleum jelly or oils.  Get vaccinated for HPV and hepatitis.  If you have not received these vaccines in the past, talk to your health care provider about whether one or both might be right for you.   Avoid risky sex practices that can break the skin.  WHAT SHOULD I DO IF I THINK I HAVE AN STD?  See your health care provider.   Inform all sexual partners. They should be tested and treated for any STDs.  Do not have sex until your health care provider says it is OK. WHEN SHOULD I GET HELP? Seek immediate medical care if:  You develop  severe abdominal pain.  You are a man and notice swelling or pain in the testicles.  You are a woman and notice swelling or pain in your vagina. Document Released: 05/05/2002 Document Revised: 12/03/2012 Document Reviewed: 09/02/2012 [facility] Patient Information 2014 [place], [place].

## 2013-05-04 NOTE — ED Notes (Signed)
Pelvic cart at bedside. 

## 2013-05-05 LAB — GC/CHLAMYDIA PROBE AMP
CT Probe RNA: NEGATIVE
GC PROBE AMP APTIMA: NEGATIVE

## 2013-05-05 LAB — HIV ANTIBODY (ROUTINE TESTING W REFLEX): HIV: NONREACTIVE

## 2013-05-05 LAB — RPR: RPR Ser Ql: NONREACTIVE

## 2013-12-28 ENCOUNTER — Encounter (HOSPITAL_COMMUNITY): Payer: Self-pay | Admitting: Emergency Medicine

## 2014-03-11 ENCOUNTER — Encounter (HOSPITAL_COMMUNITY): Payer: Self-pay | Admitting: Obstetrics & Gynecology

## 2014-03-15 ENCOUNTER — Encounter (HOSPITAL_COMMUNITY): Payer: Self-pay | Admitting: Emergency Medicine

## 2014-03-15 ENCOUNTER — Emergency Department (HOSPITAL_COMMUNITY): Payer: Self-pay

## 2014-03-15 ENCOUNTER — Emergency Department (HOSPITAL_COMMUNITY)
Admission: EM | Admit: 2014-03-15 | Discharge: 2014-03-16 | Disposition: A | Discharge status: Home / Self Care | Payer: Self-pay | Attending: Emergency Medicine | Admitting: Emergency Medicine

## 2014-03-15 DIAGNOSIS — Z8659 Personal history of other mental and behavioral disorders: Secondary | ICD-10-CM | POA: Insufficient documentation

## 2014-03-15 DIAGNOSIS — Z9851 Tubal ligation status: Secondary | ICD-10-CM | POA: Insufficient documentation

## 2014-03-15 DIAGNOSIS — R110 Nausea: Secondary | ICD-10-CM | POA: Insufficient documentation

## 2014-03-15 DIAGNOSIS — R102 Pelvic and perineal pain: Secondary | ICD-10-CM

## 2014-03-15 DIAGNOSIS — N72 Inflammatory disease of cervix uteri: Secondary | ICD-10-CM | POA: Insufficient documentation

## 2014-03-15 DIAGNOSIS — Z720 Tobacco use: Secondary | ICD-10-CM | POA: Insufficient documentation

## 2014-03-15 DIAGNOSIS — Z792 Long term (current) use of antibiotics: Secondary | ICD-10-CM | POA: Insufficient documentation

## 2014-03-15 DIAGNOSIS — Z3202 Encounter for pregnancy test, result negative: Secondary | ICD-10-CM | POA: Insufficient documentation

## 2014-03-15 DIAGNOSIS — Z880 Allergy status to penicillin: Secondary | ICD-10-CM | POA: Insufficient documentation

## 2014-03-15 DIAGNOSIS — Z8619 Personal history of other infectious and parasitic diseases: Secondary | ICD-10-CM | POA: Insufficient documentation

## 2014-03-15 LAB — URINALYSIS, ROUTINE W REFLEX MICROSCOPIC
Bilirubin Urine: NEGATIVE
Glucose, UA: NEGATIVE mg/dL
Hgb urine dipstick: NEGATIVE
KETONES UR: NEGATIVE mg/dL
Leukocytes, UA: NEGATIVE
Nitrite: NEGATIVE
Protein, ur: NEGATIVE mg/dL
SPECIFIC GRAVITY, URINE: 1.014 (ref 1.005–1.030)
Urobilinogen, UA: 0.2 mg/dL (ref 0.0–1.0)
pH: 7.5 (ref 5.0–8.0)

## 2014-03-15 LAB — CBC WITH DIFFERENTIAL/PLATELET
BASOS PCT: 1 % (ref 0–1)
Basophils Absolute: 0 10*3/uL (ref 0.0–0.1)
EOS ABS: 0.1 10*3/uL (ref 0.0–0.7)
Eosinophils Relative: 3 % (ref 0–5)
HCT: 34.2 % — ABNORMAL LOW (ref 36.0–46.0)
Hemoglobin: 10.9 g/dL — ABNORMAL LOW (ref 12.0–15.0)
Lymphocytes Relative: 42 % (ref 12–46)
Lymphs Abs: 1.8 10*3/uL (ref 0.7–4.0)
MCH: 25.8 pg — ABNORMAL LOW (ref 26.0–34.0)
MCHC: 31.9 g/dL (ref 30.0–36.0)
MCV: 80.9 fL (ref 78.0–100.0)
Monocytes Absolute: 0.2 10*3/uL (ref 0.1–1.0)
Monocytes Relative: 6 % (ref 3–12)
NEUTROS ABS: 2.2 10*3/uL (ref 1.7–7.7)
NEUTROS PCT: 50 % (ref 43–77)
Platelets: 236 10*3/uL (ref 150–400)
RBC: 4.23 MIL/uL (ref 3.87–5.11)
RDW: 14.9 % (ref 11.5–15.5)
WBC: 4.4 10*3/uL (ref 4.0–10.5)

## 2014-03-15 LAB — COMPREHENSIVE METABOLIC PANEL
ALBUMIN: 3.5 g/dL (ref 3.5–5.2)
ALK PHOS: 44 U/L (ref 39–117)
ALT: 17 U/L (ref 0–35)
AST: 18 U/L (ref 0–37)
Anion gap: 5 (ref 5–15)
BUN: 8 mg/dL (ref 6–23)
CALCIUM: 8.9 mg/dL (ref 8.4–10.5)
CO2: 31 mmol/L (ref 19–32)
Chloride: 102 mEq/L (ref 96–112)
Creatinine, Ser: 0.78 mg/dL (ref 0.50–1.10)
GFR calc Af Amer: >90 mL/min (ref >90)
GFR calc non Af Amer: >90 mL/min (ref >90)
Glucose, Bld: 102 mg/dL — ABNORMAL HIGH (ref 70–99)
POTASSIUM: 3.6 mmol/L (ref 3.5–5.1)
SODIUM: 138 mmol/L (ref 135–145)
Total Bilirubin: 0.5 mg/dL (ref 0.3–1.2)
Total Protein: 6.7 g/dL (ref 6.0–8.3)

## 2014-03-15 LAB — PREGNANCY, URINE: PREG TEST UR: NEGATIVE

## 2014-03-15 LAB — LIPASE, BLOOD: Lipase: 25 U/L (ref 11–59)

## 2014-03-15 MED ORDER — SODIUM CHLORIDE 0.9 % IV BOLUS (SEPSIS)
1000.0000 mL | Freq: Once | INTRAVENOUS | Status: AC
  Administered 2014-03-15: 1000 mL via INTRAVENOUS

## 2014-03-15 MED ORDER — MORPHINE SULFATE 4 MG/ML IJ SOLN
4.0000 mg | Freq: Once | INTRAMUSCULAR | Status: AC
  Administered 2014-03-15: 4 mg via INTRAVENOUS
  Filled 2014-03-15: qty 1

## 2014-03-15 NOTE — ED Provider Notes (Signed)
CSN: [id number]     Arrival date & time 03/15/14  2041 History   First MD Initiated Contact with Patient 03/15/14 2141     Chief Complaint  Patient presents with  . Abdominal Pain     (Consider location/radiation/quality/duration/timing/severity/associated sxs/prior Treatment) HPI Comments: Patient is a 205 P295 29 year old female past medical history significant for PID, depression, tobacco abuse presenting to the emergency department for right-sided intermittent sharp pelvic pain that began yesterday with associated vaginal spotting 1 week. Patient states that the pain is worse with movement and sexual intercourse. She endorses associated nausea and one episode of nonbloody diarrhea, denies any vomiting. No medications tried prior to arrival. No modifying factors identified. Patient does endorse recent unprotected sexual intercourse. Abdominal surgical history includes tubal ligation.   Past Medical History  Diagnosis Date  . Depression   . Panic attack   . HPV in female    Past Surgical History  Procedure Laterality Date  . No past surgeries    . Tubal ligation Bilateral 12/15/2012    Procedure: POST PARTUM TUBAL LIGATION;  Surgeon: Allie BossierMyra C Dove, MD;  Location: [facility];  Service: Gynecology;  Laterality: Bilateral;   Family History  Problem Relation Age of Onset  . Cancer Brother   . Cancer Paternal Grandfather    History  Substance Use Topics  . Smoking status: Current Every Day Smoker -- 0.50 packs/day    Types: Cigarettes  . Smokeless tobacco: Not on file  . Alcohol Use: No   OB History    Gravida Para Term Preterm AB TAB SAB Ectopic Multiple Living   5 5 5       5      Review of Systems  Gastrointestinal: Positive for nausea and abdominal pain.  Genitourinary: Positive for vaginal bleeding and pelvic pain.  All other systems reviewed and are negative.     Allergies  Vicodin and Penicillins  Home Medications   Prior to Admission medications   Medication Sig  Start Date End Date Taking? Authorizing Provider  doxycycline (VIBRAMYCIN) 100 MG capsule Take 1 capsule (100 mg total) by mouth 2 (two) times daily. 05/04/13   Juliet RudeNathan R. Pickering, MD  doxycycline (VIBRAMYCIN) 100 MG capsule Take 1 capsule (100 mg total) by mouth 2 (two) times daily. 03/16/14   Susan Bleich L Kaysee Hergert, PA-C  metroNIDAZOLE (FLAGYL) 500 MG tablet Take 1 tablet (500 mg total) by mouth 2 (two) times daily. 05/04/13   Juliet RudeNathan R. Pickering, MD  metroNIDAZOLE (FLAGYL) 500 MG tablet Take 1 tablet (500 mg total) by mouth 2 (two) times daily. 03/16/14   Cyan Moultrie L Shandon Burlingame, PA-C   BP 105/62 mmHg  Pulse 86  Temp(Src) 98 F (36.7 C) (Oral)  Resp 18  SpO2 99%  LMP 03/08/2014 Physical Exam  Constitutional: She is oriented to person, place, and time. She appears well-developed and well-nourished. No distress.  HENT:  Head: Normocephalic and atraumatic.  Right Ear: External ear normal.  Left Ear: External ear normal.  Nose: Nose normal.  Mouth/Throat: Oropharynx is clear and moist. No oropharyngeal exudate.  Eyes: Conjunctivae are normal.  Neck: Neck supple.  Cardiovascular: Normal rate, regular rhythm and normal heart sounds.   Pulmonary/Chest: Effort normal and breath sounds normal.  Abdominal: Soft. Normal appearance and bowel sounds are normal. She exhibits no distension. There is tenderness. There is no rigidity, no rebound and no guarding.    Neurological: She is alert and oriented to person, place, and time.  Skin: Skin is warm and dry. She  is not diaphoretic.  Nursing note and vitals reviewed.   ED Course  Procedures (including critical care time) Medications  sodium chloride 0.9 % bolus 1,000 mL (0 mLs Intravenous Stopped 03/16/14 0108)  morphine 4 MG/ML injection 4 mg (4 mg Intravenous Given 03/15/14 2245)  cefTRIAXone (ROCEPHIN) injection 250 mg (250 mg Intramuscular Given 03/16/14 0110)  doxycycline (VIBRA-TABS) tablet 100 mg (100 mg Oral Given 03/16/14 0108)  lidocaine  (PF) (XYLOCAINE) 1 % injection (  Duplicate 03/16/14 0110)  lidocaine (PF) (XYLOCAINE) 1 % injection 0.9 mL (0.9 mLs Infiltration Given 03/16/14 0110)    Labs Review Labs Reviewed  WET PREP, GENITAL - Abnormal; Notable for the following:    Clue Cells Wet Prep HPF POC FEW (*)    WBC, Wet Prep HPF POC FEW (*)    All other components within normal limits  CBC WITH DIFFERENTIAL - Abnormal; Notable for the following:    Hemoglobin 10.9 (*)    HCT 34.2 (*)    MCH 25.8 (*)    All other components within normal limits  COMPREHENSIVE METABOLIC PANEL - Abnormal; Notable for the following:    Glucose, Bld 102 (*)    All other components within normal limits  LIPASE, BLOOD  PREGNANCY, URINE  URINALYSIS, ROUTINE W REFLEX MICROSCOPIC  HIV ANTIBODY (ROUTINE TESTING)  GC/CHLAMYDIA PROBE AMP ([place])    Imaging Review US Transvaginal Non-ob  03/15/2014   CLINICAL DATA:  Pelvic pain. RIGHT lower quadrant pain for 2 weeks, worse yesterday.  EXAM: TRANSABDOMINAL AND TRANSVAGINAL ULTRASOUND OF PELVIS  DOPPLER ULTRASOUND OF OVARIES  TECHNIQUE: Both transabdominal and transvaginal ultrasound examinations of the pelvis were performed. Transabdominal technique was performed for global imaging of the pelvis including uterus, ovaries, adnexal regions, and pelvic cul-de-sac.  It was necessary to proceed with endovaginal exam following the transabdominal exam to visualize the ovaries and endometrium. Color and duplex Doppler ultrasound was utilized to evaluate blood flow to the ovaries.  COMPARISON:  None.  FINDINGS: Uterus  Measurements: 8.4 x 4.6 x 6.7 cm. No fibroids or other mass visualized.  Endometrium  Thickness: 12 mm.  No focal abnormality visualized.  Right ovary  Measurements: 3.9 x 1.7 x 2.5 cm. Normal appearance/no adnexal mass.  Left ovary  Measurements: 3.4 x 2.2 x 2.8 cm. Normal appearance/no adnexal mass.  Pulsed Doppler evaluation of both ovaries demonstrates normal low-resistance arterial and  venous waveforms.  Other findings  Small to moderate amount of free fluid.  IMPRESSION: Small to moderate amount of free fluid may be physiologic, otherwise unremarkable pelvic ultrasound.   Electronically Signed   By: Awilda Metro   On: 03/15/2014 23:40   US Pelvis Complete  03/15/2014   CLINICAL DATA:  Pelvic pain. RIGHT lower quadrant pain for 2 weeks, worse yesterday.  EXAM: TRANSABDOMINAL AND TRANSVAGINAL ULTRASOUND OF PELVIS  DOPPLER ULTRASOUND OF OVARIES  TECHNIQUE: Both transabdominal and transvaginal ultrasound examinations of the pelvis were performed. Transabdominal technique was performed for global imaging of the pelvis including uterus, ovaries, adnexal regions, and pelvic cul-de-sac.  It was necessary to proceed with endovaginal exam following the transabdominal exam to visualize the ovaries and endometrium. Color and duplex Doppler ultrasound was utilized to evaluate blood flow to the ovaries.  COMPARISON:  None.  FINDINGS: Uterus  Measurements: 8.4 x 4.6 x 6.7 cm. No fibroids or other mass visualized.  Endometrium  Thickness: 12 mm.  No focal abnormality visualized.  Right ovary  Measurements: 3.9 x 1.7 x 2.5 cm. Normal appearance/no adnexal  mass.  Left ovary  Measurements: 3.4 x 2.2 x 2.8 cm. Normal appearance/no adnexal mass.  Pulsed Doppler evaluation of both ovaries demonstrates normal low-resistance arterial and venous waveforms.  Other findings  Small to moderate amount of free fluid.  IMPRESSION: Small to moderate amount of free fluid may be physiologic, otherwise unremarkable pelvic ultrasound.   Electronically Signed   By: Awilda Metro   On: 03/15/2014 23:40   [place] Art/ven Flow Abd Pelv Doppler  03/15/2014   CLINICAL DATA:  Pelvic pain. RIGHT lower quadrant pain for 2 weeks, worse yesterday.  EXAM: TRANSABDOMINAL AND TRANSVAGINAL ULTRASOUND OF PELVIS  DOPPLER ULTRASOUND OF OVARIES  TECHNIQUE: Both transabdominal and transvaginal ultrasound examinations of the pelvis were  performed. Transabdominal technique was performed for global imaging of the pelvis including uterus, ovaries, adnexal regions, and pelvic cul-de-sac.  It was necessary to proceed with endovaginal exam following the transabdominal exam to visualize the ovaries and endometrium. Color and duplex Doppler ultrasound was utilized to evaluate blood flow to the ovaries.  COMPARISON:  None.  FINDINGS: Uterus  Measurements: 8.4 x 4.6 x 6.7 cm. No fibroids or other mass visualized.  Endometrium  Thickness: 12 mm.  No focal abnormality visualized.  Right ovary  Measurements: 3.9 x 1.7 x 2.5 cm. Normal appearance/no adnexal mass.  Left ovary  Measurements: 3.4 x 2.2 x 2.8 cm. Normal appearance/no adnexal mass.  Pulsed Doppler evaluation of both ovaries demonstrates normal low-resistance arterial and venous waveforms.  Other findings  Small to moderate amount of free fluid.  IMPRESSION: Small to moderate amount of free fluid may be physiologic, otherwise unremarkable pelvic ultrasound.   Electronically Signed   By: Awilda Metro   On: 03/15/2014 23:40     EKG Interpretation None      Exam performed by Francee Piccolo L,  exam chaperoned Date: 03/16/2014 Pelvic exam: normal external genitalia without evidence of trauma. VULVA: normal appearing vulva with no masses, tenderness or lesion. VAGINA: normal appearing vagina with normal color and discharge, no lesions. CERVIX: normal appearing cervix without lesions, cervical motion tenderness absent, cervical os closed with out purulent discharge; vaginal discharge - white, Wet prep and DNA probe for chlamydia and GC obtained.   ADNEXA: normal adnexa in size, mild right sided tenderness, no masses UTERUS: uterus is normal size, shape, consistency and nontender.   MDM   Final diagnoses:  Pelvic pain in female  Cervicitis    Filed Vitals:   03/16/14 0100  BP: 105/62  Pulse: 86  Temp: 98 F (36.7 C)  Resp: 18   Afebrile, NAD, non-toxic appearing,  AAOx4.  I have reviewed nursing notes, vital signs, and all appropriate lab and imaging results for this patient.  Patient to be discharged with instructions to follow up with OBGYN. Pt understands GC/Chlamydia cultures pending and that they will need to inform all sexual partners within the last 6 months if results return positive. Pt has been treated prophylacticly with doxycycline and rocephin due to pts history, pelvic exam, and wet prep with increased WBCs. Pt advised that she will receive a call in 48 hours if the test is positive and to refrain from sexual activity for 48 hours. If the test is positive, pt is advised to refrain from sexual activity for 10 days for the medicine to take effect.  Pt not concerning for PID because hemodynamically stable and no cervical motion tenderness on pelvic exam. Pt has also been treated with flagyl for Bacterial Vaginosis. Pt has been advised to  not drink alcohol while on this medication.   Discussed that because pt has had recent unprotected sex, might want to consider getting tested for HIV as well. Counseled pt that latex condoms are the only way to prevent against STDs or HIV. Patient is stable at time of discharge          Jeannetta Ellis, PA-C 03/16/14 0315  Rolland Porter, MD 03/20/14 [phone]

## 2014-03-15 NOTE — ED Notes (Signed)
Pt c/o RUQ pain onset yesterday associated with diarrhea. Denies NV. Area tender on palpation. Pt also reports spotting this week. Denies discharge. LMP three weeks ago. Pt reports worsening pain on movement and during intercourse.

## 2014-03-15 NOTE — ED Notes (Signed)
Pt. reports right abdominal pain with diarrhea x1 onset yesterday , denies nausea or vomitting , no fever or chills.

## 2014-03-16 LAB — GC/CHLAMYDIA PROBE AMP (~~LOC~~) NOT AT ARMC
Chlamydia: NEGATIVE
Neisseria Gonorrhea: NEGATIVE

## 2014-03-16 LAB — WET PREP, GENITAL
Trich, Wet Prep: NONE SEEN
YEAST WET PREP: NONE SEEN

## 2014-03-16 MED ORDER — DOXYCYCLINE HYCLATE 100 MG PO TABS
100.0000 mg | ORAL_TABLET | Freq: Once | ORAL | Status: AC
  Administered 2014-03-16: 100 mg via ORAL
  Filled 2014-03-16: qty 1

## 2014-03-16 MED ORDER — DOXYCYCLINE HYCLATE 100 MG PO CAPS: 100.0000 mg | ORAL_CAPSULE | Freq: Two times a day (BID) | ORAL | Status: DC

## 2014-03-16 MED ORDER — LIDOCAINE HCL (PF) 1 % IJ SOLN
INTRAMUSCULAR | Status: AC
  Filled 2014-03-16: qty 5

## 2014-03-16 MED ORDER — LIDOCAINE HCL (PF) 1 % IJ SOLN
0.9000 mL | Freq: Once | INTRAMUSCULAR | Status: AC
  Administered 2014-03-16: 0.9 mL

## 2014-03-16 MED ORDER — CEFTRIAXONE SODIUM 250 MG IJ SOLR
250.0000 mg | Freq: Once | INTRAMUSCULAR | Status: AC
  Administered 2014-03-16: 250 mg via INTRAMUSCULAR
  Filled 2014-03-16: qty 250

## 2014-03-16 MED ORDER — METRONIDAZOLE 500 MG PO TABS: 500.0000 mg | ORAL_TABLET | Freq: Two times a day (BID) | ORAL | Status: DC

## 2014-03-16 NOTE — ED Notes (Signed)
Pt given turkey sandwich and Sprite 

## 2014-03-16 NOTE — Discharge Instructions (Signed)
Please follow up with your primary care physician in 1-2 days. If you do not have one please call the [facility] number listed above. Please take your antibiotic until completion. Please read all discharge instructions and return precautions.   Cervicitis Cervicitis is a soreness and swelling (inflammation) of the cervix. Your cervix is located at the bottom of your uterus. It opens up to the vagina. CAUSES   Sexually transmitted infections (STIs).   Allergic reaction.   Medicines or birth control devices that are put in the vagina.   Injury to the cervix.   Bacterial infections.  RISK FACTORS You are at greater risk if you:  Have unprotected sexual intercourse.  Have sexual intercourse with many partners.  Began sexual intercourse at an early age.  Have a history of STIs. SYMPTOMS  There may be no symptoms. If symptoms occur, they may include:   Gray, white, yellow, or bad-smelling vaginal discharge.   Pain or itching of the area outside the vagina.   Painful sexual intercourse.   Lower abdominal or lower back pain, especially during intercourse.   Frequent urination.   Abnormal vaginal bleeding between periods, after sexual intercourse, or after menopause.   Pressure or a heavy feeling in the pelvis.  DIAGNOSIS  Diagnosis is made after a pelvic exam. Other tests may include:   Examination of any discharge under a microscope (wet prep).   A Pap test.  TREATMENT  Treatment will depend on the cause of cervicitis. If it is caused by an STI, both you and your partner will need to be treated. Antibiotic medicines will be given.  HOME CARE INSTRUCTIONS   Do not have sexual intercourse until your health care provider says it is okay.   Do not have sexual intercourse until your partner has been treated, if your cervicitis is caused by an STI.   Take your antibiotics as directed. Finish them even if you start to feel better.  SEEK  MEDICAL CARE IF:  Your symptoms come back.   You have a fever.  MAKE SURE YOU:   Understand these instructions.  Will watch your condition.  Will get help right away if you are not doing well or get worse. Document Released: 02/12/2005 Document Revised: 02/17/2013 Document Reviewed: 08/06/2012 [facility] Patient Information 2015 [place]. This information is not intended to replace advice given to you by your health care provider. Make sure you discuss any questions you have with your health care provider.

## 2014-03-17 LAB — HIV ANTIBODY (ROUTINE TESTING W REFLEX)
HIV 1/O/2 Abs-Index Value: <1 (ref <1.00)
HIV-1/HIV-2 Ab: NONREACTIVE

## 2014-08-05 ENCOUNTER — Encounter (HOSPITAL_COMMUNITY): Payer: Self-pay | Admitting: Obstetrics & Gynecology

## 2015-03-16 ENCOUNTER — Emergency Department (HOSPITAL_COMMUNITY)
Admission: EM | Admit: 2015-03-16 | Discharge: 2015-03-16 | Disposition: A | Discharge status: Home / Self Care | Payer: Self-pay | Attending: Emergency Medicine | Admitting: Emergency Medicine

## 2015-03-16 ENCOUNTER — Encounter (HOSPITAL_COMMUNITY): Payer: Self-pay | Admitting: Emergency Medicine

## 2015-03-16 DIAGNOSIS — Z8659 Personal history of other mental and behavioral disorders: Secondary | ICD-10-CM | POA: Insufficient documentation

## 2015-03-16 DIAGNOSIS — Z880 Allergy status to penicillin: Secondary | ICD-10-CM | POA: Insufficient documentation

## 2015-03-16 DIAGNOSIS — K0381 Cracked tooth: Secondary | ICD-10-CM | POA: Insufficient documentation

## 2015-03-16 DIAGNOSIS — K002 Abnormalities of size and form of teeth: Secondary | ICD-10-CM | POA: Insufficient documentation

## 2015-03-16 DIAGNOSIS — F17210 Nicotine dependence, cigarettes, uncomplicated: Secondary | ICD-10-CM | POA: Insufficient documentation

## 2015-03-16 DIAGNOSIS — R220 Localized swelling, mass and lump, head: Secondary | ICD-10-CM | POA: Insufficient documentation

## 2015-03-16 DIAGNOSIS — K0889 Other specified disorders of teeth and supporting structures: Secondary | ICD-10-CM | POA: Insufficient documentation

## 2015-03-16 DIAGNOSIS — Z8619 Personal history of other infectious and parasitic diseases: Secondary | ICD-10-CM | POA: Insufficient documentation

## 2015-03-16 MED ORDER — CLINDAMYCIN HCL 150 MG PO CAPS: 450.0000 mg | ORAL_CAPSULE | Freq: Three times a day (TID) | ORAL | Status: DC

## 2015-03-16 MED ORDER — KETOROLAC TROMETHAMINE 60 MG/2ML IM SOLN
60.0000 mg | Freq: Once | INTRAMUSCULAR | Status: AC
  Administered 2015-03-16: 60 mg via INTRAMUSCULAR
  Filled 2015-03-16: qty 2

## 2015-03-16 NOTE — ED Notes (Signed)
Pt states she has a chipped tooth that has been causing pain so last night she took some motrin and Alieve this am at 0700 and at 1100 am pts top lip was swollen. Pt reports no difficulty swallowing no trouble breathing. Lung are clear. No swelling in tongue or throat. Pt is is no acute distress.

## 2015-03-16 NOTE — ED Provider Notes (Signed)
CSN: [id number]     Arrival date & time 03/16/15  1227 History  By signing my name below, I, Stephanie Mckay, attest that this documentation has been prepared under the direction and in the presence of Stephanie Heimlich, PA-C Electronically Signed: Charline Mckay, ED Scribe 03/16/2015 at 2:13 PM.   Chief Complaint  Patient presents with  . Dental Pain  . Oral Swelling   The history is provided by the patient. No language interpreter was used.   HPI Comments: Stephanie Mckay is a 30 y.o. female who presents to the Emergency Department complaining of constant dental pain onset yesterday. Pt states that she chipped her tooth 5 years ago but has had intermittent pain over the past 5 years with cold weather. She has been treating dental pain with ibuprofen since yesterday without significant relief. She tried Aleve this morning and noted upper lip swelling approximately 2-3 hours later. She states that she has never taken Aleve before. She denies itching. Her lip swelling has improved since this morning. Denies rash, eyelid swelling, tongue swelling, sensation of throat closing, shortness of breathing, difficulty breathing or inability to handle secretions. She is also complaining of a "knot" of her upper gumline. She is unsure how long it has been there. It is not-tender. She noticed it today when she was feeling her swollen lip. Pt has not followed up with a dentist since she is uninsured. Allergy to Penicillins. No other complaints today.   Past Medical History  Diagnosis Date  . Depression   . Panic attack   . HPV in female    Past Surgical History  Procedure Laterality Date  . No past surgeries    . Tubal ligation Bilateral 12/15/2012    Procedure: POST PARTUM TUBAL LIGATION;  Surgeon: Allie Bossier, MD;  Location: [facility];  Service: Gynecology;  Laterality: Bilateral;   Family History  Problem Relation Age of Onset  . Cancer Brother   . Cancer Paternal Grandfather    Social History   Substance Use Topics  . Smoking status: Current Every Day Smoker -- 0.50 packs/day    Types: Cigarettes  . Smokeless tobacco: None  . Alcohol Use: No   OB History    Gravida Para Term Preterm AB TAB SAB Ectopic Multiple Living   Review of Systems  HENT: Positive for dental problem and facial swelling (top lip).   All other systems reviewed and are negative.  Allergies  Vicodin and Penicillins  Home Medications   Prior to Admission medications   Medication Sig Start Date End Date Taking? Authorizing Provider  clindamycin (CLEOCIN) 150 MG capsule Take 3 capsules (450 mg total) by mouth 3 (three) times daily. Take 3 capsules three times a day for 10 days. 03/16/15   Stephanie Quintin, PA-C  doxycycline (VIBRAMYCIN) 100 MG capsule Take 1 capsule (100 mg total) by mouth 2 (two) times daily. 05/04/13   Stephanie Core, MD  doxycycline (VIBRAMYCIN) 100 MG capsule Take 1 capsule (100 mg total) by mouth 2 (two) times daily. 03/16/14   Stephanie Piepenbrink, PA-C  metroNIDAZOLE (FLAGYL) 500 MG tablet Take 1 tablet (500 mg total) by mouth 2 (two) times daily. 05/04/13   Stephanie Core, MD  metroNIDAZOLE (FLAGYL) 500 MG tablet Take 1 tablet (500 mg total) by mouth 2 (two) times daily. 03/16/14   Stephanie Piepenbrink, PA-C   BP 133/89 mmHg  Pulse 90  Temp(Src) 99 F (37.2 C) (  Oral)  Resp 16  Ht  (1.727 m)  Wt 218 lb (98.884 kg)  BMI 33.15 kg/m2  SpO2 99%  LMP 03/12/2015 Physical Exam  Constitutional: She appears well-developed and well-nourished. No distress.  HENT:  Head: Normocephalic and atraumatic.  Right Ear: External ear normal.  Left Ear: External ear normal.  Mouth/Throat: Uvula is midline and oropharynx is clear and moist. Mucous membranes are not dry. No trismus in the jaw. Abnormal dentition. No dental abscesses.    Fracture of tooth 8. No erythema or obvious dental abscess of the surrounding gumline. Unable to visualize or palpate the "knot" pt reports.  No trismus. No eyelid, lip or tongue edema. No rashes over the face. Oropharynx is clear without edema or erythema. Uvula is midline without edema. Pt handling secretions well.   Eyes: Conjunctivae are normal. Right eye exhibits no discharge. Left eye exhibits no discharge. No scleral icterus.  Neck: Normal range of motion. Neck supple.  No stridor. FROM of neck intact. No soft tissue swelling of the neck.  Cardiovascular: Normal rate, regular rhythm and normal heart sounds.   Pulmonary/Chest: Effort normal and breath sounds normal. No respiratory distress. She has no wheezes. She has no rales.  Breathing unlabored. Lungs CTAB  Abdominal: Soft.  Musculoskeletal: Normal range of motion.  Moves all extremities spontaneously  Neurological: She is alert. Coordination normal.  Skin: Skin is warm and dry. No rash noted.  No urticaria   Psychiatric: She has a normal mood and affect. Her behavior is normal.  Nursing note and vitals reviewed.  ED Course  Procedures (including critical care time) DIAGNOSTIC STUDIES: Oxygen Saturation is 99% on RA, normal by my interpretation.     Labs Review Labs Reviewed - No data to display  Imaging Review No results found.   EKG Interpretation None      MDM   Final diagnoses:  Pain, dental    Patient presenting with dental pain and upper lip swelling. Denies difficulty breathing, swallowing or handling secretions. No obvious abscess on exam. No soft tissue swelling of the cheek or neck. Exam unconcerning for Ludwig's angina or spread of infection. Pt's lip is not swollen and she reports it appears to be normal. No edema of eyelids or tpngue, no oropharyngeal edema or erythema, no stridor, no wheezing and patient is in no respiratory distress. Patient is hemodynamically stable, in no respiratory distress, and denies the feeling of throat closing. Unlikely to be allergic rxn since pt has been taking ibuprofen without issue. Pt is allergic to PCN so given  clindamycin instead. strongly encouraged pt to follow up with dentist. Instructed to return to the ED if they have recurrent lip swelling with symptoms including difficulty breathing, difficulty swallowing, sensation of throat closing or swelling of the face or tongue. Pt is agreeable with plan & verbalizes understanding. Return precautions given in discharge paperwork and discussed with pt at bedside. Pt stable for discharge  I personally performed the services described in this documentation, which was scribed in my presence. The recorded information has been reviewed and is accurate.    Stephanie Heimlich, PA-C 03/16/15 1436  Blane Ohara, MD 03/16/15 [phone]

## 2015-03-16 NOTE — Discharge Instructions (Signed)
Schedule a follow up appointment with a dentist from the resource guide.   Dental Pain    Dental pain may be caused by many things, including:  Tooth decay (cavities or caries). Cavities expose the nerve of your tooth to air and hot or cold temperatures. This can cause pain or discomfort.  Abscess or infection. A dental abscess is a collection of infected pus from a bacterial infection in the inner part of the tooth (pulp). It usually occurs at the end of the tooth's root.  Injury.  An unknown reason (idiopathic). Your pain may be mild or severe. It may only occur when:  You are chewing.  You are exposed to hot or cold temperature.  You are eating or drinking sugary foods or beverages, such as soda or candy. Your pain may also be constant.  HOME CARE INSTRUCTIONS  Watch your dental pain for any changes. The following actions may help to lessen any discomfort that you are feeling:  Take medicines only as directed by your dentist.  If you were prescribed an antibiotic medicine, finish all of it even if you start to feel better.  Keep all follow-up visits as directed by your dentist. This is important.  Do not apply heat to the outside of your face.  Rinse your mouth or gargle with salt water if directed by your dentist. This helps with pain and swelling.  You can make salt water by adding  tsp of salt to 1 cup of warm water. Apply ice to the painful area of your face:  Put ice in a plastic bag.  Place a towel between your skin and the bag.  Leave the ice on for 20 minutes, 2-3 times per day. Avoid foods or drinks that cause you pain, such as:  Very hot or very cold foods or drinks.  Sweet or sugary foods or drinks. SEEK MEDICAL CARE IF:  Your pain is not controlled with medicines.  Your symptoms are worse.  You have new symptoms. SEEK IMMEDIATE MEDICAL CARE IF:  You are unable to open your mouth.  You are having trouble breathing or swallowing.  You have a fever.  Your face,  neck, or jaw is swollen. This information is not intended to replace advice given to you by your health care provider. Make sure you discuss any questions you have with your health care provider.  Document Released: 02/12/2005 Document Revised: 06/29/2014 Document Reviewed: 02/08/2014  Elsevier Interactive Patient Education 2016 [employer].    Emergency Department Resource Guide 1) Find a Doctor and Pay Out of Pocket Although you won't have to find out who is covered by your insurance plan, it is a good idea to ask around and get recommendations. You will then need to call the office and see if the doctor you have chosen will accept you as a new patient and what types of options they offer for patients who are self-pay. Some doctors offer discounts or will set up payment plans for their patients who do not have insurance, but you will need to ask so you aren't surprised when you get to your appointment.  2) Contact Your Local Health Department Not all health departments have doctors that can see patients for sick visits, but many do, so it is worth a call to see if yours does. If you don't know where your local health department is, you can check in your phone book. The CDC also has a tool to help you locate your state's health department,  and many state websites also have listings of all of their local health departments.  3) Find a Walk-in Clinic If your illness is not likely to be very severe or complicated, you may want to try a walk in clinic. These are popping up all over the country in pharmacies, drugstores, and shopping centers. They're usually staffed by nurse practitioners or physician assistants that have been trained to treat common illnesses and complaints. They're usually fairly quick and inexpensive. However, if you have serious medical issues or chronic medical problems, these are probably not your best option.  No Primary Care Doctor: - Call Health Connect at  [phone] - they  can help you locate a primary care doctor that  accepts your insurance, provides certain services, etc. - Physician Referral Service- [phone]  Chronic Pain Problems: Organization   [phone] Address  Phone   Notes  [facility]  [phone] Patients need to be referred by their primary care doctor.   Medication Assistance: Organization         Address  Phone   Notes  [facility] Medication [facility] [address] [place] [place], [place] [address] [phone] --Must be a resident of [facility] -- Must have NO insurance coverage whatsoever (no Medicaid/ Medicare, etc.) -- The pt. MUST have a primary care doctor that directs their care regularly and follows them in the community   [facility]  [phone]   [employer]  [phone]    Agencies that provide inexpensive medical care: Organization         Address  Phone   Notes  [facility]  [phone]   [facility]    [phone]   [facility] [address] [place], [place] [address] [phone]   [facility] [address], [place] [phone]   Planned Parenthood    [phone]   [facility]    [phone]   [facility]  [address], [place] Phone:  [phone], Fax:  [phone] Hours of Operation:  9 am - 6 pm, M-F.  Also accepts Medicaid/Medicare and self-pay.  [facility]  [address], [place] Phone: [phone], Fax: [phone]. Hours of Operation:  8:30 am - 5:30 pm, M-F.  Also accepts Medicaid and self-pay.  [facility] [place] [address] [place] Phone: [phone]   [facility] [address] Natasha Bence [place], [place] [phone], Ext. [phone] Mondays & Thursdays: 7-9 AM.  First 15 patients are seen on a first come, first serve basis.    Medicaid-accepting  [facility] Providers:  Organization         Address  Phone   Notes  [facility] [address], [place] [phone] Also accepts self-pay patients.  [facility] [address] Laurell Josephs [place], [place]  [phone]   [facility] [address], [place] [phone]   [facility] [address], [place] [phone]   Renaye Rakers [address], [place]   [phone] Only accepts [place] patients after they have their name applied to their card.   Self-Pay (no insurance) in [facility]:  Organization  Address  Phone   Notes  Sickle Cell Patients, [facility] [address] [place], [place] [phone]   [facility] [address] [place], [place] [phone]   [facility] [place]  [address], [place] [phone]   [facility]/Dr. Osei-Bonsu  [address], [place] or [address], [place] [phone] Phone number for both [place] and [place] locations is the same.  [facility] [address] [place] [phone]   [facility] [address] [place] or [address] [phone]   [facility] [address] [place] [phone], phone; [phone], fax Sees patients 1st and 3rd Saturday of every month.  Must not qualify for public or private insurance (i.e. Medicaid, Medicare, [place] Health Choice, Veterans' Benefits)  Household income should be no more than 200% of the poverty level The clinic cannot treat you if you are pregnant or think you are pregnant  Sexually transmitted diseases are not treated at the clinic.    Dental Care: Organization         Address  Phone  Notes  [facility] Department of [facility] [address] [place], [place] [phone] Accepts children up to age 30 who are enrolled in [place] or [place] Health Choice; pregnant women with a Medicaid card; and children who have applied for Medicaid or [place] Health Choice, but were declined, whose parents can pay a reduced fee at time of service.  [facility]  [address], [place] [phone] Accepts children up to age 45 who are enrolled in [place] or [place] Health Choice; pregnant women with a Medicaid card; and children who have applied for Medicaid or [place] Health Choice, but were declined, whose parents can pay a reduced fee at time of service.  [facility]  [address] [place], [place] [phone] Patients are seen by appointment only. Walk-ins are not accepted. [facility] will see patients 53 years of age and older. Monday - Tuesday (8am-5pm) Most Wednesdays (8:30-5pm) $30 per visit, cash only  [facility]  [address], [facility] [phone] Patients are seen by appointment only. Walk-ins are not accepted. [facility] will see patients 59 years of age and older. One Wednesday Evening (Monthly: Volunteer Based).  $30 per visit, cash only  [employer]  [phone] for adults; Children under age 43, call Graduate Pediatric Dentistry at [phone]. Children aged 58-14, please call [phone] to request a pediatric application.  Dental services are provided in all areas of dental care including fillings, crowns and bridges, complete and partial dentures, implants, gum treatment, root canals, and extractions. Preventive care is also provided. Treatment is provided to both adults and children. Patients are selected via a lottery and there is often a waiting list.   [facility] [address] [place]  [phone] [email or web address]   [facility] [address] [place], [place] [phone], Ext. [phone] Second and Fourth Thursday of each month, opens at 6:30 AM; Clinic ends at 9 AM.  Patients are seen on a first-come first-served basis, and a limited number are seen during each clinic.   [facility]  [address] [place], [place], [place] [phone]   Eligibility Requirements You must have lived in [place], [place], or [place] counties for at least the last three months.   You cannot be eligible for state or federal sponsored [place]healthcare insurance, including CIGNA[place]eterans Administration, [place], or [employer]icare.   You generally cannot be eligible for healthcare insurance through your employer.    How to apply: Eligibility screenings are held every Tuesday and Wednesday afternoon from 1:00 pm until 4:00 pm. You do not need an appointment for the interview!  [facility] [address], [place], [place] [phone]   [facility]  [phone]   [facility]  [phone]   [facility]  [phone]

## 2016-03-23 ENCOUNTER — Encounter (HOSPITAL_COMMUNITY): Payer: Self-pay | Admitting: *Deleted

## 2016-03-23 ENCOUNTER — Emergency Department (HOSPITAL_COMMUNITY)
Admission: EM | Admit: 2016-03-23 | Discharge: 2016-03-23 | Disposition: A | Discharge status: Home / Self Care | Payer: Self-pay | Attending: Dermatology | Admitting: Dermatology

## 2016-03-23 DIAGNOSIS — F17210 Nicotine dependence, cigarettes, uncomplicated: Secondary | ICD-10-CM | POA: Insufficient documentation

## 2016-03-23 DIAGNOSIS — Z5321 Procedure and treatment not carried out due to patient leaving prior to being seen by health care provider: Secondary | ICD-10-CM | POA: Insufficient documentation

## 2016-03-23 DIAGNOSIS — K0889 Other specified disorders of teeth and supporting structures: Secondary | ICD-10-CM | POA: Insufficient documentation

## 2016-03-23 NOTE — ED Triage Notes (Signed)
Pt has pain and broken front tooth, woke up with swelling to upper lip this am. Airway intact.

## 2016-03-23 NOTE — ED Notes (Signed)
Pt handed in labels and bp cuff without saying anything and walked out the door. Nurse was notified.

## 2016-04-15 ENCOUNTER — Encounter (HOSPITAL_COMMUNITY): Payer: Self-pay | Admitting: Emergency Medicine

## 2016-04-15 DIAGNOSIS — D6489 Other specified anemias: Secondary | ICD-10-CM | POA: Insufficient documentation

## 2016-04-15 DIAGNOSIS — G5621 Lesion of ulnar nerve, right upper limb: Secondary | ICD-10-CM | POA: Insufficient documentation

## 2016-04-15 DIAGNOSIS — F17210 Nicotine dependence, cigarettes, uncomplicated: Secondary | ICD-10-CM | POA: Insufficient documentation

## 2016-04-15 DIAGNOSIS — F5089 Other specified eating disorder: Secondary | ICD-10-CM | POA: Insufficient documentation

## 2016-04-15 NOTE — ED Triage Notes (Signed)
C/o R elbow pain x 3 months.  Pt states pain is now worse and radiates down R forearm.  No known injury.

## 2016-04-16 ENCOUNTER — Emergency Department (HOSPITAL_COMMUNITY)
Admission: EM | Admit: 2016-04-16 | Discharge: 2016-04-16 | Disposition: A | Discharge status: Home / Self Care | Payer: Self-pay | Attending: Emergency Medicine | Admitting: Emergency Medicine

## 2016-04-16 ENCOUNTER — Emergency Department (HOSPITAL_COMMUNITY): Payer: Self-pay

## 2016-04-16 DIAGNOSIS — D508 Other iron deficiency anemias: Secondary | ICD-10-CM

## 2016-04-16 DIAGNOSIS — G5622 Lesion of ulnar nerve, left upper limb: Secondary | ICD-10-CM

## 2016-04-16 DIAGNOSIS — F5089 Other specified eating disorder: Secondary | ICD-10-CM

## 2016-04-16 LAB — CBC
HEMATOCRIT: 30.1 % — AB (ref 36.0–46.0)
HEMOGLOBIN: 8.5 g/dL — AB (ref 12.0–15.0)
MCH: 19.5 pg — AB (ref 26.0–34.0)
MCHC: 28.2 g/dL — ABNORMAL LOW (ref 30.0–36.0)
MCV: 69.2 fL — ABNORMAL LOW (ref 78.0–100.0)
Platelets: 229 10*3/uL (ref 150–400)
RBC: 4.35 MIL/uL (ref 3.87–5.11)
RDW: 18.9 % — ABNORMAL HIGH (ref 11.5–15.5)
WBC: 5.9 10*3/uL (ref 4.0–10.5)

## 2016-04-16 MED ORDER — DICLOFENAC SODIUM 50 MG PO TBEC: 50.0000 mg | DELAYED_RELEASE_TABLET | Freq: Two times a day (BID) | ORAL | 0 refills | Status: AC

## 2016-04-16 MED ORDER — FERROUS SULFATE 325 (65 FE) MG PO TABS: 325.0000 mg | ORAL_TABLET | Freq: Every day | ORAL | 3 refills | Status: AC

## 2016-04-16 MED ORDER — OXYCODONE-ACETAMINOPHEN 5-325 MG PO TABS
1.0000 | ORAL_TABLET | Freq: Once | ORAL | Status: AC
  Administered 2016-04-16: 1 via ORAL
  Filled 2016-04-16: qty 1

## 2016-04-16 NOTE — ED Provider Notes (Signed)
[facility]-EMERGENCY DEPT Provider Note   CSN: [id number] Arrival date & time: 04/15/16  2344     History   Chief Complaint Chief Complaint  Patient presents with  . Arm Pain  . Elbow Pain    HPI Stephanie Mckay is a 31 y.o. female who presents to the ED with right elbow and forearm pain. She reports that the pain started over a month ago but over the past few days the pain has become severe. The pain is worse with flexion and extension of the elbow. She is unsure of any injury to the elbow. She has taken nothing for the pain. Patient does report sitting in her recliner with her elbow propped on the arm of the chair.   Patient in exam room eating starch. I ask the patient if she has hx of anemia and she does. She reports eating starch on a regular basis.     HPI  Past Medical History:  Diagnosis Date  . Depression   . HPV in female   . Panic attack     Patient Active Problem List   Diagnosis Date Noted  . Sterilization 11/11/2012    Past Surgical History:  Procedure Laterality Date  . NO PAST SURGERIES    . TUBAL LIGATION Bilateral 12/15/2012   Procedure: POST PARTUM TUBAL LIGATION;  Surgeon: Allie Bossier, MD;  Location: [facility];  Service: Gynecology;  Laterality: Bilateral;    OB History    Gravida Para Term Preterm AB Living   5 5 5     5    SAB TAB Ectopic Multiple Live Births           2       Home Medications    Prior to Admission medications   Medication Sig Start Date End Date Taking? Authorizing Provider  diclofenac (VOLTAREN) 50 MG EC tablet Take 1 tablet (50 mg total) by mouth 2 (two) times daily. 04/16/16   Hope Orlene Och, NP  ferrous sulfate 325 (65 FE) MG tablet Take 1 tablet (325 mg total) by mouth daily. 04/16/16   Hope Orlene Och, NP    Family History Family History  Problem Relation Age of Onset  . Cancer Brother   . Cancer Paternal Grandfather     Social History Social History  Substance Use Topics  . Smoking status: Current Every Day Smoker      Packs/day: 0.50    Types: Cigarettes  . Smokeless tobacco: Never Used  . Alcohol use No     Allergies   Vicodin [hydrocodone-acetaminophen] and Penicillins   Review of Systems Review of Systems  Constitutional: Negative for fever.  Respiratory: Negative for shortness of breath.   Cardiovascular: Negative for chest pain.  Gastrointestinal: Negative for nausea and vomiting.  Musculoskeletal: Positive for arthralgias.       Right elbow and forearm pain  Skin: Negative for wound.     Physical Exam Updated Vital Signs BP 116/81 (BP Location: Left Arm)   Pulse 80   Temp 98.2 F (36.8 C) (Oral)   Resp 16   Ht 5\' 8"  (1.727 m)   LMP 03/16/2016   SpO2 100%   Physical Exam  Constitutional: She is oriented to person, place, and time. She appears well-developed and well-nourished.  HENT:  Head: Normocephalic and atraumatic.  Eyes: EOM are normal.  Neck: Neck supple.  Cardiovascular: Normal rate.   Pulmonary/Chest: Effort normal.  Musculoskeletal:       Right elbow: She exhibits no effusion and  no laceration. Decreased range of motion: due to pain. Tenderness found. Olecranon process tenderness noted.       Right forearm: She exhibits tenderness. She exhibits no swelling, no deformity and no laceration.  Radial pulses 2+, adequate circulation, equal grips.  Neurological: She is alert and oriented to person, place, and time. She has normal strength.  Skin: Skin is warm and dry.  Psychiatric: She has a normal mood and affect.  Nursing note and vitals reviewed.    ED Treatments / Results  Labs (all labs ordered are listed, but only abnormal results are displayed) Labs Reviewed  CBC - Abnormal; Notable for the following:       Result Value   Hemoglobin 8.5 (*)    HCT 30.1 (*)    MCV 69.2 (*)    MCH 19.5 (*)    MCHC 28.2 (*)    RDW 18.9 (*)    All other components within normal limits    Radiology Dg Elbow Complete Right  Result Date: 04/16/2016 CLINICAL DATA:   Right elbow pain for several months EXAM: RIGHT ELBOW - COMPLETE 3+ VIEW COMPARISON:  None. FINDINGS: There is no evidence of fracture, dislocation, or joint effusion. There is no evidence of arthropathy or other focal bone abnormality. Soft tissues are unremarkable. IMPRESSION: Negative. Electronically Signed   By: Jasmine PangKim  Fujinaga M.D.   On: 04/16/2016 01:50   Dg Forearm Right  Result Date: 04/16/2016 CLINICAL DATA:  Chronic pain EXAM: RIGHT FOREARM - 2 VIEW COMPARISON:  None. FINDINGS: There is no evidence of fracture or other focal bone lesions. Soft tissues are unremarkable. IMPRESSION: Negative. Electronically Signed   By: Jasmine PangKim  Fujinaga M.D.   On: 04/16/2016 01:50    Procedures Procedures (including critical care time)  Medications Ordered in ED Medications  oxyCODONE-acetaminophen (PERCOCET/ROXICET) 5-325 MG per tablet 1 tablet (1 tablet Oral Given 04/16/16 0105)     Initial Impression / Assessment and Plan / ED Course  I have reviewed the triage vital signs and the nursing notes.  Pertinent labs & imaging results that were available during my care of the patient were reviewed by me and considered in my medical decision making (see chart for details).  Final Clinical Impressions(s) / ED Diagnoses  31 y.o. female with left elbow and forearm pain, PICA and iron deficient anemia stable for d/c. Ace wrap to left elbow, and NSAIDS. Will treat for anemia. Discussed with the patient results of blood work, x-rays and plan of care. She voices understanding and agrees with plan.   Final diagnoses:  Ulnar nerve compression, left  Other iron deficiency anemia  Pica    New Prescriptions New Prescriptions   DICLOFENAC (VOLTAREN) 50 MG EC TABLET    Take 1 tablet (50 mg total) by mouth 2 (two) times daily.   FERROUS SULFATE 325 (65 FE) MG TABLET    Take 1 tablet (325 mg total) by mouth daily.     [address]Hope Glenn HeightsM Neese, NP 04/16/16 [phone]    Tomasita CrumbleAdeleke Oni, MD 04/16/16 [phone]

## 2016-04-16 NOTE — ED Notes (Signed)
Pt in room eating box of cornstarch. Pt reports hx of anemia

## 2016-04-16 NOTE — Discharge Instructions (Signed)
[facility]. Take the medications as directed. Take a daily vitamin in addition. Return here as needed.

## 2016-04-16 NOTE — ED Notes (Signed)
Pt verbalized understanding discharge instructions and denies any further needs or questions at this time. VS stable, ambulatory and steady gait.   

## 2017-05-13 IMAGING — DX DG ELBOW COMPLETE 3+V*R*
4 series · 4 of 4 positions shown · non-contrast
Comparison: None.

CLINICAL DATA: Right elbow pain for several months

EXAM:
RIGHT ELBOW - COMPLETE 3+ VIEW

[elbow ap]
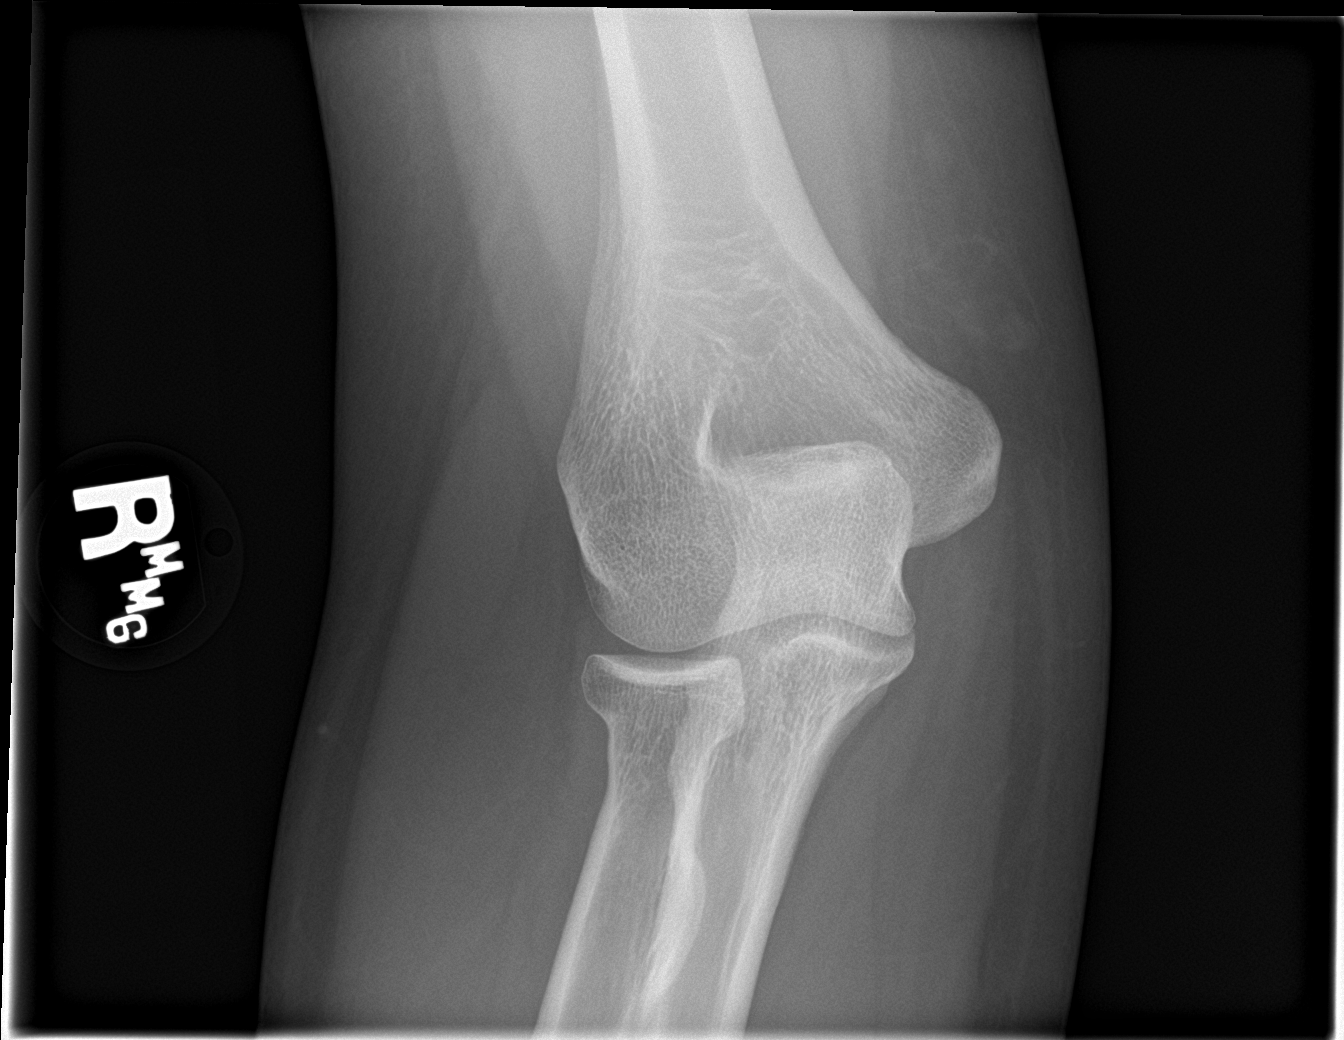

[elbow obl (1 of 2)]
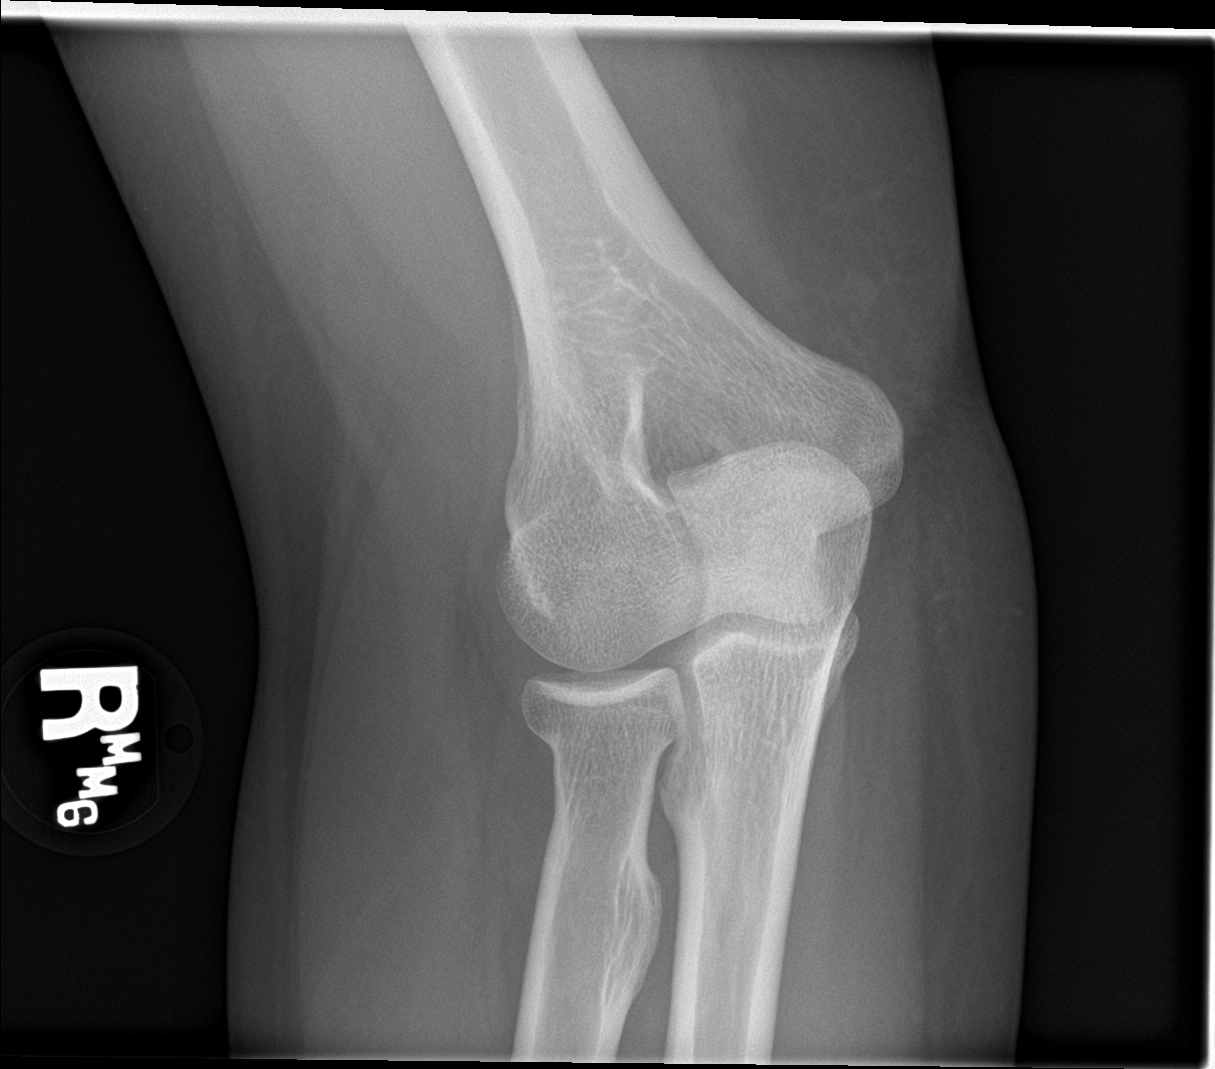

[elbow obl (2 of 2)]
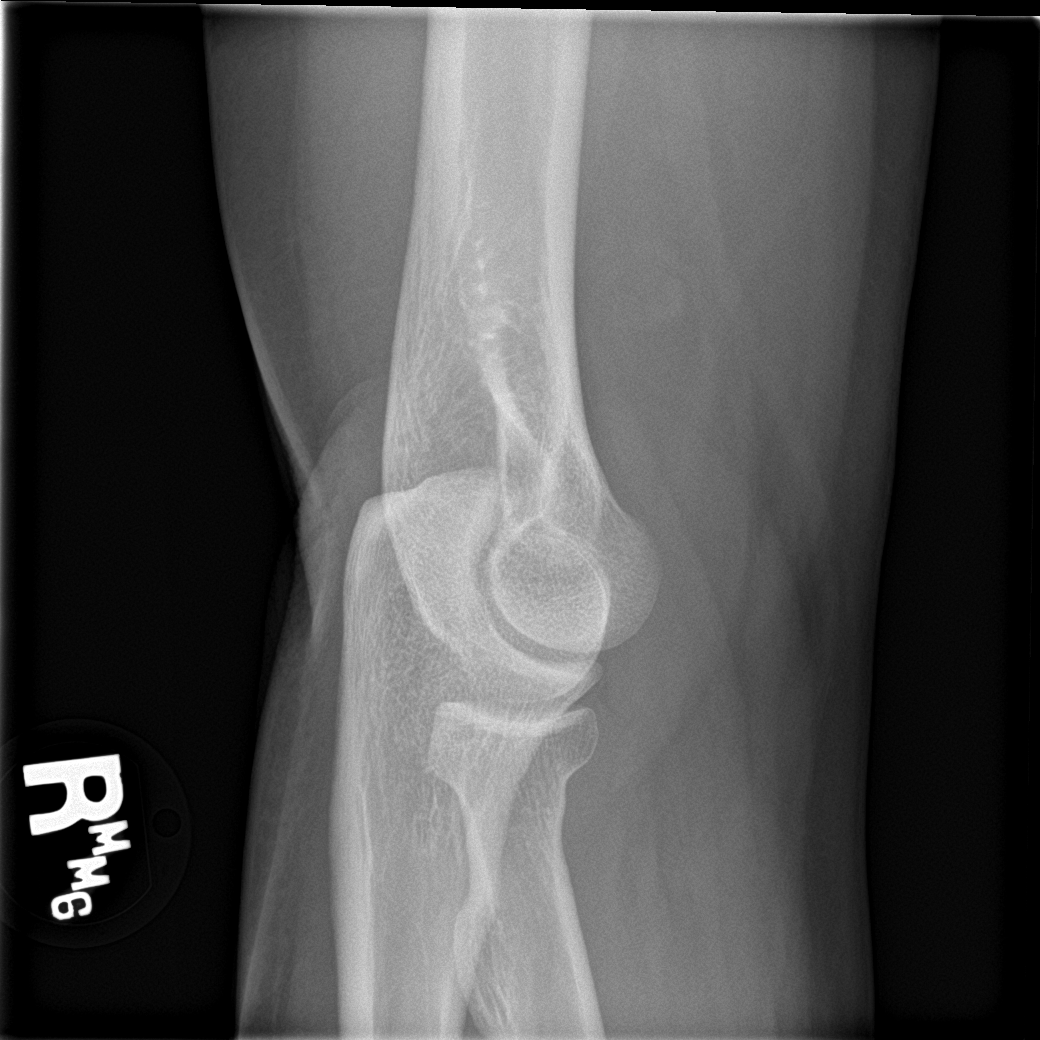

[elbow lat]
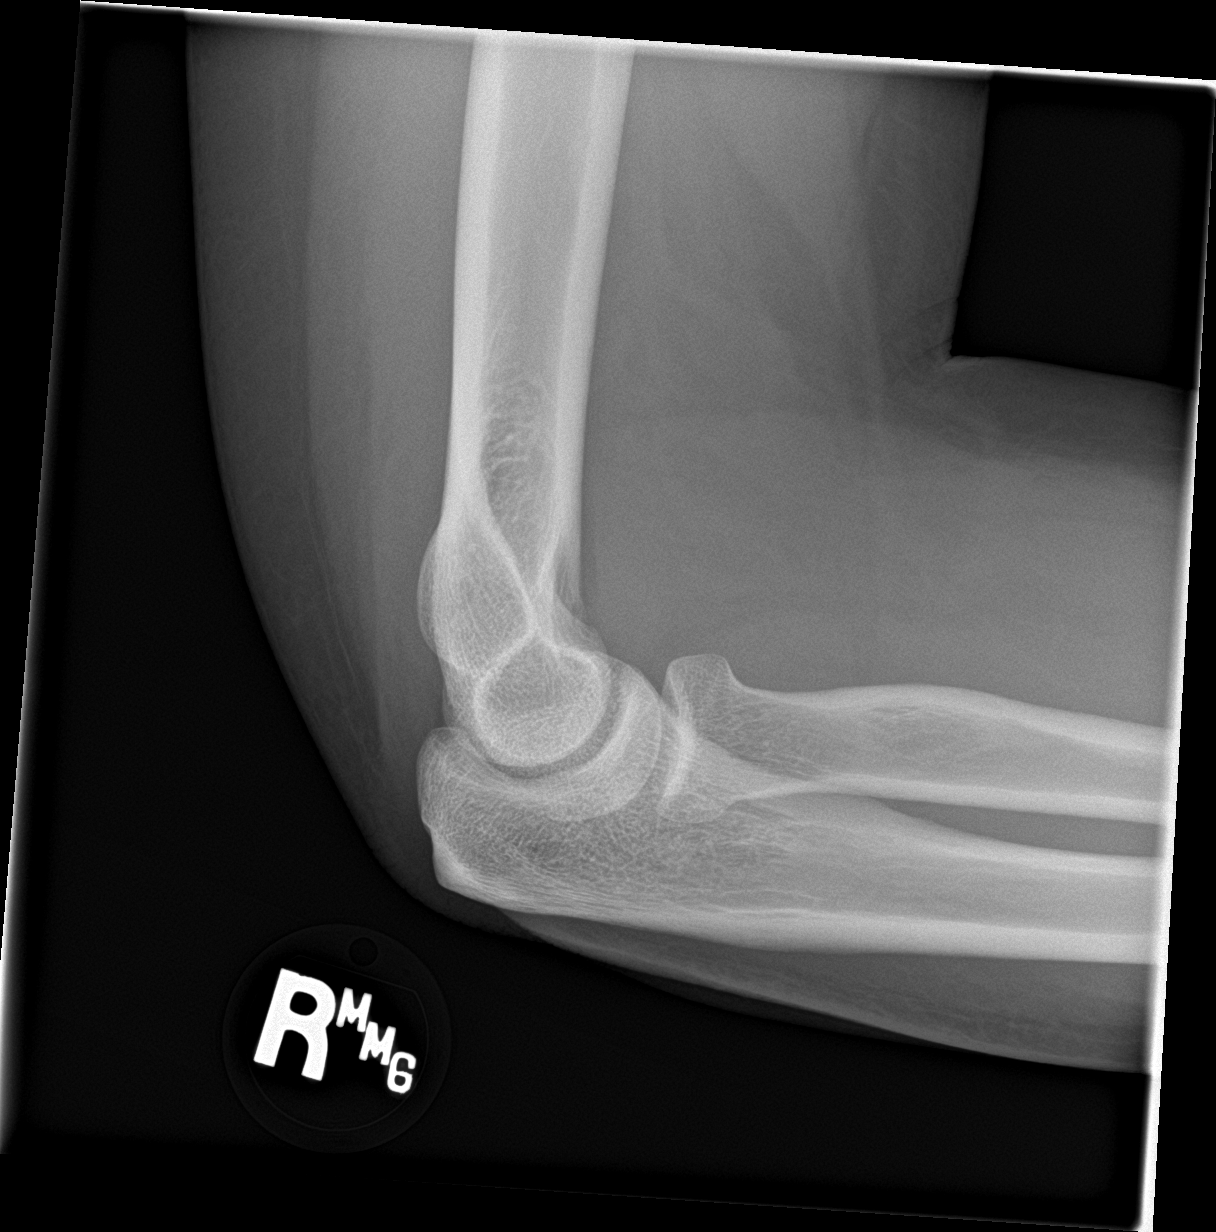

[4 of 4 positions shown; findings below may reference images not displayed]

FINDINGS: There is no evidence of fracture, dislocation, or joint effusion.
There is no evidence of arthropathy or other focal bone abnormality.
Soft tissues are unremarkable.
IMPRESSION: Negative.

## 2018-10-17 ENCOUNTER — Emergency Department (HOSPITAL_BASED_OUTPATIENT_CLINIC_OR_DEPARTMENT_OTHER): Admission: EM | Admit: 2018-10-17 | Discharge: 2018-10-17 | Discharge status: Against Medical Advice | Payer: Self-pay

## 2018-10-17 ENCOUNTER — Other Ambulatory Visit: Payer: Self-pay
